# Patient Record
Sex: Male | Born: 1935 | Race: White | Hispanic: No | Smoking: Former smoker
Health system: Southern US, Community
[De-identification: ages and names within clinical notes are randomized; demographics above are authoritative.]

## PROBLEM LIST (undated history)

## (undated) DIAGNOSIS — I4891 Unspecified atrial fibrillation: Secondary | ICD-10-CM

## (undated) DIAGNOSIS — K219 Gastro-esophageal reflux disease without esophagitis: Secondary | ICD-10-CM

## (undated) DIAGNOSIS — E785 Hyperlipidemia, unspecified: Secondary | ICD-10-CM

## (undated) DIAGNOSIS — I2510 Atherosclerotic heart disease of native coronary artery without angina pectoris: Secondary | ICD-10-CM

## (undated) DIAGNOSIS — I519 Heart disease, unspecified: Secondary | ICD-10-CM

## (undated) DIAGNOSIS — I10 Essential (primary) hypertension: Secondary | ICD-10-CM

## (undated) DIAGNOSIS — I471 Supraventricular tachycardia: Secondary | ICD-10-CM

## (undated) HISTORY — DX: Unspecified atrial fibrillation: I48.91

## (undated) HISTORY — DX: Supraventricular tachycardia: I47.1

## (undated) HISTORY — DX: Atherosclerotic heart disease of native coronary artery without angina pectoris: I25.10

## (undated) HISTORY — DX: Gastro-esophageal reflux disease without esophagitis: K21.9

## (undated) HISTORY — DX: Heart disease, unspecified: I51.9

## (undated) HISTORY — DX: Hyperlipidemia, unspecified: E78.5

## (undated) HISTORY — DX: Essential (primary) hypertension: I10

## (undated) HISTORY — PX: INGUINAL HERNIA REPAIR: SUR1180

---

## 1991-07-28 HISTORY — PX: CORONARY ARTERY BYPASS GRAFT: SHX141

## 1999-07-09 ENCOUNTER — Other Ambulatory Visit: Admission: RE | Admit: 1999-07-09 | Discharge: 1999-07-09 | Payer: Self-pay | Admitting: Gastroenterology

## 1999-07-09 ENCOUNTER — Encounter (INDEPENDENT_AMBULATORY_CARE_PROVIDER_SITE_OTHER): Payer: Self-pay | Admitting: Specialist

## 2005-03-31 HISTORY — PX: CARDIOVASCULAR STRESS TEST: SHX262

## 2005-04-10 ENCOUNTER — Inpatient Hospital Stay (HOSPITAL_BASED_OUTPATIENT_CLINIC_OR_DEPARTMENT_OTHER): Admission: RE | Admit: 2005-04-10 | Discharge: 2005-04-10 | Payer: Self-pay | Admitting: Cardiology

## 2005-04-10 HISTORY — PX: CARDIAC CATHETERIZATION: SHX172

## 2006-02-23 ENCOUNTER — Encounter: Admission: RE | Admit: 2006-02-23 | Discharge: 2006-02-23 | Payer: Self-pay | Admitting: Gastroenterology

## 2010-06-05 ENCOUNTER — Ambulatory Visit: Payer: Self-pay | Admitting: Cardiology

## 2010-12-12 NOTE — Cardiovascular Report (Signed)
NAME:  Darryl [name], Darryl [name] NO.:  [id number]   MEDICAL RECORD NO.:  [id number]          PATIENT TYPE:  OIB   LOCATION:  1965                         FACILITY:  [facility]   PHYSICIAN:  Peter M. Swaziland, M.D.  DATE OF BIRTH:  10-05-1927   DATE OF PROCEDURE:  04/10/2005  DATE OF DISCHARGE:                              CARDIAC CATHETERIZATION   HISTORY OF PRESENT ILLNESS:  Mr. Mcmann is a 77 year old white male who is  status post coronary artery bypass surgery in 1993. Cardiac catheterization  in 1997 showed a new occlusion of the right coronary which was not grafted.  He has been treated medically since that time. He presents recently with  symptoms of increased chest pain and nitroglycerin use. Cardiolite study  demonstrates new anterior apical ischemia.   PROCEDURES:  Left heart catheterization, coronary and left ventricular  angiography, LIMA graft angiography, left subclavian artery angiography,  saphenous vein graft angiography x3. Access is via right femoral artery  using standard Seldinger technique.   EQUIPMENT:  4-French, 4 cm right and left Judkins catheter, 4-French pigtail  catheter, 4-French IMA catheter.   CONTRAST:  120 cc of Omnipaque.   MEDICATIONS:  Local anesthesia with 1% Xylocaine.   HEMODYNAMIC DATA:  Aortic pressure was 150/75 with a mean of 104.  Left  ventricle pressure is 156 with EDP of 31 mmHg.   ANGIOGRAPHIC DATA:  Left coronary arises and distributes normally. The left  main coronary is diffusely diseased in the mid to distal vessel up to 30-  40%.   The left anterior descending artery gives rise to two diagonal branches that  arise high in the vessel. The LAD is occluded after the second diagonal  branch. The first diagonal branch is diffusely diseased proximally up to 80-  90%. The second diagonal branch also has significant proximal disease up to  90%.   The left circumflex coronary artery is occluded proximally. The distal left  circumflex in the AV groove fills by left to left collaterals.   The right coronary artery is occluded proximally after the conus branch. The  distal right coronary fills both by right to right and left to right  collaterals.   The saphenous vein graft to the first obtuse marginal vessel is widely  patent, but has diffuse irregularities. It fills a very large obtuse  marginal vessel which has no significant disease and excellent runoff.   Saphenous vein graft to the second diagonal is occluded.   The saphenous vein graft to the first diagonal is patent. There is a 90%  stenosis in the native vessel following the graft insertion. This graft  retrograde fills the septal perforator branch which also offers good  collaterals to the distal right coronary artery.   The left subclavian artery is without significant stenosis.   The LIMA graft to the LAD is widely patent. The far distal LAD at the apex  has 80% narrowing.   Left ventricular angiography was performed in the RAO view. This  demonstrates normal left ventricular size with severe inferobasal  hypokinesia and distal anterior wall hypokinesia.  Overall, left ventricular  systolic function is mild to moderately reduced with ejection fraction  estimated at 45%. There is mild mitral regurgitation.   FINAL INTERPRETATION:  1.  Severe three-vessel obstructive coronary artery disease.  2.  Patent LIMA graft to LAD.  3.  Patent saphenous vein graft to the first obtuse marginal vessel.  4.  Patent saphenous vein graft to the first diagonal with high-grade      stenosis in the native vessel following the vein graft insertion.  5.  Occluded saphenous vein graft to second diagonal.  6.  Mild to moderate left ventricular dysfunction.   PLAN:  It would appear that the areas of ischemia involve the distribution  of the first and second diagonal branch. Both of these vessels are small in  caliber. The large LAD and marginal vessels are well  supplied by grafts and  the right coronary distribution was supplied by collaterals. At this point I  would recommend aggressive medical therapy and risk factor modification.           ______________________________  Peter M. Swaziland, M.D.     PMJ/MEDQ  D:  04/10/2005  T:  04/10/2005  Job:  [id number]   cc:   Georgann Housekeeper, MD  [address]  [place]  [place] [address]  Fax: [phone]

## 2010-12-12 NOTE — H&P (Signed)
NAME:  Darryl [name], Darryl [name] NO.:  [id number]   MEDICAL RECORD NO.:  [id number]          PATIENT TYPE:  AMB   LOCATION:                               FACILITY:  [facility]   PHYSICIAN:  Peter M. Swaziland, M.D.  DATE OF BIRTH:  14-Dec-1927   DATE OF ADMISSION:  04/10/2005  DATE OF DISCHARGE:                                HISTORY & PHYSICAL   HISTORY OF PRESENT ILLNESS:  Darryl [name] is a 77 year old white male with  known history of coronary artery disease.  He is status post coronary artery  bypass surgery in 1993.  This included an LIMA graft to the LAD, a saphenous  vein graft to the first diagonal, a saphenous vein graft to the second  diagonal, and a saphenous vein graft to the obtuse marginal branch.  He  subsequently had cardiac catheterization in 1997.  This demonstrated that  all his native vessels were occluded proximally.  The LIMA graft to the LAD  was patent.  The LIMA graft to the first diagonal was patent and the  saphenous vein graft to the marginal vessel was patent.  However, the  saphenous vein graft to the second diagonal which was a very small branch  was occluded.  The native right coronary artery was also occluded with a  long segment of disease, but did have left-to-right collaterals.  He has  been managed medically since that time.  Recently, he presents with symptoms  of substernal chest tightness.  Because of these symptoms he underwent a  follow-up stress Cardiolite study.  Patient had evidence of fixed inferior  basal defect as well as a predominantly fixed partially reversible anterior  apical defect which was new compared to 2002.  His ejection fraction had  declined from 68% to 43%.  Because of these changes it was recommended he  undergo cardiac catheterization at this time.   PAST MEDICAL HISTORY:  1.  Significant for history of paroxysmal supraventricular tachycardia.  2.  He has a history of hypertension.  3.  History of hyperlipidemia.  4.   He has a history of gastroesophageal reflux disease.   He has no known allergies.   CURRENT MEDICATIONS:  1.  Lotensin 20 mg b.i.d.  2.  Atenolol 75 mg per day.  3.  Aspirin 81 mg per day.  4.  Imdur 60 mg per day.  5.  Lorazepam 1 mg q.8h.  6.  HCTZ 12.5 mg per day.  7.  Protonix 40 mg per day.  8.  Crestor 10 mg per day.   SOCIAL HISTORY:  Patient is married.  He denies tobacco or alcohol use at  this time.  He has no children.   FAMILY HISTORY:  Positive with father having history of coronary disease.   REVIEW OF SYSTEMS:  As noted in HPI.  He does note repetitive symptoms of  tachy palpitations, particularly if he eats a heavy meal or has esophageal  reflux symptoms.  He has had no dizziness or syncope.  He had previously  been intolerant of Lipitor and Pravachol due to  myalgias.   PHYSICAL EXAMINATION:  GENERAL:  Patient is a thin, elderly white male in no  distress.  VITAL SIGNS:  Weight 152, blood pressure 142/90, pulse 60 and irregular.  HEENT:  Normocephalic, atraumatic.  Pupils are equal, round, and reactive to  light and accommodation.  Extraocular movements are full.  Sclerae clear.  Oropharynx is clear.  NECK:  Without JVD, adenopathy, thyromegaly, or bruits.  LUNGS:  Clear.  CARDIAC:  Without gallop, murmur, rub, or click.  ABDOMEN:  Soft and nontender.  He has no hepatosplenomegaly, masses, or  bruits.  EXTREMITIES:  Without edema.  Pulses are 2+ and symmetric throughout.  He  has no cyanosis.  NEUROLOGIC:  Intact.   LABORATORY DATA:  Resting ECG shows normal sinus rhythm with nonspecific ST  abnormality.  He has incomplete right bundle branch block.  Chest x-ray  shows no active disease.  Coags are normal.  CBC is normal.  Glucose is 145,  BUN 18, creatinine 1.1, sodium 143, potassium 3.6, chloride 102, CO2 31.   IMPRESSION:  1.  Recurrent angina pectoris.  The patient has an abnormal Cardiolite study      showing a new anterior apical defect.  2.   Status post CABG in 1993.  3.  Hypertension.  4.  Hyperlipidemia.  5.  History of paroxysmal supraventricular tachycardia.  6.  Gastroesophageal reflux disease.   PLAN:  Will proceed with diagnostic cardiac catheterization with further  therapy pending these results.           ______________________________  Peter M. [place]azil[place], M.D.     PMJ/MEDQ  D:  04/06/2005  T:  04/06/2005  Job:  [id number]   cc:   Georgann Housekeeper, MD  [address]  [place]  [place] [address]  Fax: [phone]

## 2011-01-21 ENCOUNTER — Other Ambulatory Visit: Payer: Self-pay | Admitting: *Deleted

## 2011-01-21 MED ORDER — BENAZEPRIL HCL 20 MG PO TABS: 20.0000 mg | ORAL_TABLET | Freq: Two times a day (BID) | ORAL | Status: DC

## 2011-01-21 MED ORDER — HYDROCHLOROTHIAZIDE 12.5 MG PO CAPS: 12.5000 mg | ORAL_CAPSULE | Freq: Every day | ORAL | Status: DC

## 2011-01-21 NOTE — Telephone Encounter (Signed)
escribe medication per fax request  

## 2011-04-06 ENCOUNTER — Other Ambulatory Visit: Payer: Self-pay | Admitting: Cardiology

## 2011-04-06 MED ORDER — LORAZEPAM 1 MG PO TABS: 1.0000 mg | ORAL_TABLET | Freq: Three times a day (TID) | ORAL | Status: DC

## 2011-04-06 NOTE — Telephone Encounter (Signed)
Pt wants refill lorazapam please call

## 2011-04-14 ENCOUNTER — Other Ambulatory Visit: Payer: Self-pay | Admitting: *Deleted

## 2011-06-05 ENCOUNTER — Encounter: Payer: Self-pay | Admitting: Cardiology

## 2011-06-08 ENCOUNTER — Ambulatory Visit (INDEPENDENT_AMBULATORY_CARE_PROVIDER_SITE_OTHER): Payer: Medicare Other | Admitting: Cardiology

## 2011-06-08 ENCOUNTER — Encounter: Payer: Self-pay | Admitting: Cardiology

## 2011-06-08 VITALS — BP 144/88 | HR 77 | Ht 66.0 in | Wt 143.8 lb

## 2011-06-08 DIAGNOSIS — I4891 Unspecified atrial fibrillation: Secondary | ICD-10-CM

## 2011-06-08 DIAGNOSIS — I2510 Atherosclerotic heart disease of native coronary artery without angina pectoris: Secondary | ICD-10-CM

## 2011-06-08 DIAGNOSIS — R5381 Other malaise: Secondary | ICD-10-CM

## 2011-06-08 DIAGNOSIS — E785 Hyperlipidemia, unspecified: Secondary | ICD-10-CM

## 2011-06-08 DIAGNOSIS — R5383 Other fatigue: Secondary | ICD-10-CM

## 2011-06-08 DIAGNOSIS — I10 Essential (primary) hypertension: Secondary | ICD-10-CM

## 2011-06-08 DIAGNOSIS — I519 Heart disease, unspecified: Secondary | ICD-10-CM

## 2011-06-08 MED ORDER — RIVAROXABAN 20 MG PO TABS: 20.0000 mg | ORAL_TABLET | Freq: Every day | ORAL | Status: DC

## 2011-06-08 NOTE — Patient Instructions (Signed)
We will schedule you for fasting lab work and an echocardiogram.  I recommend Xarelto 20 mg daily to reduce your risk of stroke. Stop taking ASA.  Continue your other medication.  I will see you again in one month.

## 2011-06-13 DIAGNOSIS — E785 Hyperlipidemia, unspecified: Secondary | ICD-10-CM | POA: Insufficient documentation

## 2011-06-13 DIAGNOSIS — I10 Essential (primary) hypertension: Secondary | ICD-10-CM | POA: Insufficient documentation

## 2011-06-13 DIAGNOSIS — I2510 Atherosclerotic heart disease of native coronary artery without angina pectoris: Secondary | ICD-10-CM | POA: Insufficient documentation

## 2011-06-13 DIAGNOSIS — I4891 Unspecified atrial fibrillation: Secondary | ICD-10-CM | POA: Insufficient documentation

## 2011-06-13 DIAGNOSIS — I519 Heart disease, unspecified: Secondary | ICD-10-CM | POA: Insufficient documentation

## 2011-06-13 NOTE — Assessment & Plan Note (Addendum)
We'll followup fasting chemistries and lipid panel today. I would recommend statin therapy but the patient has refused in the past.

## 2011-06-13 NOTE — Progress Notes (Signed)
Leverne Humbles Date of Birth: 1927/10/03 Medical Record #[id number]  History of Present Illness: Darryl [name] is seen for yearly followup today. In general he feels well. He is still depressed with the loss of his wife last year. He denies any significant cardiac symptoms. Occasionally he feels a little bit lightheaded. He's had no syncope. He denies any chest pain or dyspnea. He has had no increase in edema. He has no prior history of CVA or TIA. He's had no bleeding problems.  Current Outpatient Prescriptions on File Prior to Visit  Medication Sig Dispense Refill  . aspirin 81 MG tablet Take 81 mg by mouth daily.        . ATENOLOL PO Take 75 mg by mouth daily.        . benazepril (LOTENSIN) 20 MG tablet Take 20 mg by mouth 2 (two) times daily.        . hydrochlorothiazide (,MICROZIDE/HYDRODIURIL,) 12.5 MG capsule Take 1 capsule (12.5 mg total) by mouth daily.  90 capsule  3  . isosorbide mononitrate (IMDUR) 60 MG 24 hr tablet Take 60 mg by mouth 2 (two) times daily.        . pantoprazole (PROTONIX) 40 MG tablet Take 40 mg by mouth as needed.       Marland Kitchen LORazepam (ATIVAN) 1 MG tablet Take 1 tablet (1 mg total) by mouth every 8 (eight) hours.  100 tablet  3    Allergies  Allergen Reactions  . Calan Sr     Past Medical History  Diagnosis Date  . Coronary artery disease   . LV dysfunction   . Hyperlipidemia   . Hypertension   . SVT (supraventricular tachycardia)   . GERD (gastroesophageal reflux disease)     Past Surgical History  Procedure Date  . Coronary artery bypass graft 1993    SAPHENOUS VEIN GRAFT TO THE FIRST OBTUSE MARGINAL VESSEL, SAPHENOUS VEIN GRAFT TO THE SECOND  DIAGONAL, SAPHENOUS VEIN GRAFT TO THE FIRST DIAGONAL , AND LIMA GRAFT TO THE LAD  . Inguinal hernia repair   . Cardiac catheterization 04/10/2005    EF 45%. SEVERE 3 VESSEL OBSTRUCTIVE  CAD. PATENT LIMA GRAFT TO LAD. MILD TO MODERATE LV DYSFUNCTION. OCCLUSION OF THE VEIN GRAFT TO THE DIAGONIAL  . Cardiovascular  stress test 03/31/2005    EF 43%. ISCHEMIA AND OR CAR IN THE ANTERIOAPICAL DISTRIBUTION. THERE IS A FIXED DEFECT IN THE INFEROBASAL REGION.    History  Smoking status  . Former Smoker  . Quit date: 06/05/1991  Smokeless tobacco  . Not on file    History  Alcohol Use No    Family History  Problem Relation Age of Onset  . Coronary artery disease Father     Review of Systems: As noted in history of present illness.  All other systems were reviewed and are negative.  Physical Exam: BP 144/88  Pulse 77  Ht 5\' 6"  (1.676 m)  Wt 143 lb 12.8 oz (65.227 kg)  BMI 23.21 kg/m2 He is an elderly white male in no acute distress.The patient is alert and oriented x 3.  The mood and affect are normal.  The skin is warm and dry.  Color is normal.  The HEENT exam reveals that the sclera are nonicteric.  The mucous membranes are moist.  The carotids are 2+ without bruits.  There is no thyromegaly.  There is no JVD.  The lungs are clear.  The chest wall is non tender.  The heart exam reveals an  irregular rate with a normal S1 and S2.  There are no murmurs, gallops, or rubs.  The PMI is not displaced.   Abdominal exam reveals good bowel sounds.  There is no guarding or rebound.  There is no hepatosplenomegaly or tenderness.  There are no masses.  Exam of the legs reveal no clubbing, cyanosis, or edema.  The legs are without rashes.  The distal pulses are intact.  Cranial nerves II - XII are intact.  Motor and sensory functions are intact.  The gait is normal.  LABORATORY DATA: ECG demonstrates atrial fibrillation with controlled ventricular response of 80 beats per minute. There is left axis deviation and nonspecific ST-T wave abnormality. QT is borderline prolonged at 475 ms.  Assessment / Plan:

## 2011-06-13 NOTE — Assessment & Plan Note (Signed)
Ejection fraction in the past was 45%. We will update his echocardiogram. He is on beta blocker and ACE inhibitor therapy. He has no signs or symptoms of congestive heart failure.

## 2011-06-13 NOTE — Assessment & Plan Note (Signed)
Patient has new onset of atrial fibrillation. The duration of this is unknown. He is asymptomatic. His rate control is satisfactory. I recommended anticoagulation to reduce his risk of stroke. We will start him on Xarelto 20 mg daily. I recommended he stop taking aspirin. We will obtain fasting lab work today including chemistries, CBC, TSH, and lipids. We'll schedule him for an echocardiogram. I'll followup again in one month. Unless he develops significant symptoms I would recommend long-term treatment with rate control and anticoagulation.

## 2011-06-13 NOTE — Assessment & Plan Note (Signed)
Patient is asymptomatic. We will continue medical therapy with beta blocker, ACE inhibitor, and nitrates.

## 2011-06-16 ENCOUNTER — Other Ambulatory Visit (INDEPENDENT_AMBULATORY_CARE_PROVIDER_SITE_OTHER): Payer: Medicare Other | Admitting: *Deleted

## 2011-06-16 ENCOUNTER — Ambulatory Visit (HOSPITAL_COMMUNITY): Payer: Medicare Other | Attending: Cardiology | Admitting: Radiology

## 2011-06-16 DIAGNOSIS — I4891 Unspecified atrial fibrillation: Secondary | ICD-10-CM

## 2011-06-16 DIAGNOSIS — I10 Essential (primary) hypertension: Secondary | ICD-10-CM

## 2011-06-16 DIAGNOSIS — I059 Rheumatic mitral valve disease, unspecified: Secondary | ICD-10-CM | POA: Insufficient documentation

## 2011-06-16 DIAGNOSIS — I379 Nonrheumatic pulmonary valve disorder, unspecified: Secondary | ICD-10-CM | POA: Insufficient documentation

## 2011-06-16 DIAGNOSIS — R5383 Other fatigue: Secondary | ICD-10-CM

## 2011-06-16 DIAGNOSIS — I2510 Atherosclerotic heart disease of native coronary artery without angina pectoris: Secondary | ICD-10-CM

## 2011-06-16 DIAGNOSIS — I079 Rheumatic tricuspid valve disease, unspecified: Secondary | ICD-10-CM | POA: Insufficient documentation

## 2011-06-16 DIAGNOSIS — E785 Hyperlipidemia, unspecified: Secondary | ICD-10-CM | POA: Insufficient documentation

## 2011-06-16 LAB — HEPATIC FUNCTION PANEL
ALT: 15 U/L (ref 0–53)
Bilirubin, Direct: 0 mg/dL (ref 0.0–0.3)
Total Bilirubin: 0.9 mg/dL (ref 0.3–1.2)

## 2011-06-16 LAB — CBC WITH DIFFERENTIAL/PLATELET
Basophils Relative: 0.4 % (ref 0.0–3.0)
HCT: 43 % (ref 39.0–52.0)
Hemoglobin: 14.3 g/dL (ref 13.0–17.0)
Lymphocytes Relative: 38.9 % (ref 12.0–46.0)
Lymphs Abs: 2.8 10*3/uL (ref 0.7–4.0)
MCHC: 33.2 g/dL (ref 30.0–36.0)
MCV: 83.8 fl (ref 78.0–100.0)
Monocytes Relative: 7.8 % (ref 3.0–12.0)
Neutro Abs: 3.7 10*3/uL (ref 1.4–7.7)
Neutrophils Relative %: 51.6 % (ref 43.0–77.0)
Platelets: 211 10*3/uL (ref 150.0–400.0)
RDW: 15.1 % — ABNORMAL HIGH (ref 11.5–14.6)
WBC: 7.2 10*3/uL (ref 4.5–10.5)

## 2011-06-16 LAB — BASIC METABOLIC PANEL
BUN: 18 mg/dL (ref 6–23)
Calcium: 9.2 mg/dL (ref 8.4–10.5)
Creatinine, Ser: 1.2 mg/dL (ref 0.4–1.5)
Potassium: 3.7 mEq/L (ref 3.5–5.1)

## 2011-06-16 LAB — LIPID PANEL
Cholesterol: 198 mg/dL (ref 0–200)
VLDL: 29.4 mg/dL (ref 0.0–40.0)

## 2011-06-16 LAB — TSH: TSH: 2.25 u[IU]/mL (ref 0.35–5.50)

## 2011-07-09 ENCOUNTER — Encounter: Payer: Self-pay | Admitting: Cardiology

## 2011-07-09 ENCOUNTER — Ambulatory Visit (INDEPENDENT_AMBULATORY_CARE_PROVIDER_SITE_OTHER): Payer: Medicare Other | Admitting: Cardiology

## 2011-07-09 VITALS — BP 138/82 | HR 95 | Ht 65.5 in | Wt 143.0 lb

## 2011-07-09 DIAGNOSIS — I10 Essential (primary) hypertension: Secondary | ICD-10-CM

## 2011-07-09 DIAGNOSIS — I519 Heart disease, unspecified: Secondary | ICD-10-CM

## 2011-07-09 DIAGNOSIS — E785 Hyperlipidemia, unspecified: Secondary | ICD-10-CM

## 2011-07-09 DIAGNOSIS — I4891 Unspecified atrial fibrillation: Secondary | ICD-10-CM

## 2011-07-09 DIAGNOSIS — I2510 Atherosclerotic heart disease of native coronary artery without angina pectoris: Secondary | ICD-10-CM

## 2011-07-09 NOTE — Progress Notes (Signed)
Darryl [name] Date of Birth: 25-Dec-1927 Medical Record #[id number]  History of Present Illness: Darryl [name] is seen for followup of his atrial fibrillation. This is newly diagnosed in November. We recommended anticoagulation but he reports now that he does not want to go on anticoagulation. He is on aspirin. He fully understands that he is at increased risk of stroke. He understands this and states that he is ready to die. He has no symptoms related to the atrial fibrillation. He denies any chest pain, shortness of breath, or palpitations. He has no edema.  Current Outpatient Prescriptions on File Prior to Visit  Medication Sig Dispense Refill  . aspirin 81 MG tablet Take 81 mg by mouth daily.        . ATENOLOL PO Take 75 mg by mouth daily.        . benazepril (LOTENSIN) 20 MG tablet Take 20 mg by mouth 2 (two) times daily.        . hydrochlorothiazide (,MICROZIDE/HYDRODIURIL,) 12.5 MG capsule Take 1 capsule (12.5 mg total) by mouth daily.  90 capsule  3  . isosorbide mononitrate (IMDUR) 60 MG 24 hr tablet Take 60 mg by mouth 2 (two) times daily.        . pantoprazole (PROTONIX) 40 MG tablet Take 40 mg by mouth as needed.       Marland Kitchen DISCONTD: LORazepam (ATIVAN) 1 MG tablet Take 1 tablet (1 mg total) by mouth every 8 (eight) hours.  100 tablet  3  . DISCONTD: LORazepam (ATIVAN) 1 MG tablet Take 1 tablet by mouth Twice daily.        Allergies  Allergen Reactions  . Calan Sr     Past Medical History  Diagnosis Date  . Coronary artery disease   . LV dysfunction   . Hyperlipidemia   . Hypertension   . SVT (supraventricular tachycardia)   . GERD (gastroesophageal reflux disease)   . Atrial fibrillation     Past Surgical History  Procedure Date  . Coronary artery bypass graft 1993    SAPHENOUS VEIN GRAFT TO THE FIRST OBTUSE MARGINAL VESSEL, SAPHENOUS VEIN GRAFT TO THE SECOND  DIAGONAL, SAPHENOUS VEIN GRAFT TO THE FIRST DIAGONAL , AND LIMA GRAFT TO THE LAD  . Inguinal hernia repair   .  Cardiac catheterization 04/10/2005    EF 45%. SEVERE 3 VESSEL OBSTRUCTIVE  CAD. PATENT LIMA GRAFT TO LAD. MILD TO MODERATE LV DYSFUNCTION. OCCLUSION OF THE VEIN GRAFT TO THE DIAGONIAL  . Cardiovascular stress test 03/31/2005    EF 43%. ISCHEMIA AND OR CAR IN THE ANTERIOAPICAL DISTRIBUTION. THERE IS A FIXED DEFECT IN THE INFEROBASAL REGION.    History  Smoking status  . Former Smoker  . Quit date: 06/05/1991  Smokeless tobacco  . Not on file    History  Alcohol Use No    Family History  Problem Relation Age of Onset  . Coronary artery disease Father   . Heart disease Father   . Heart attack Father   . Heart disease Mother   . Heart attack Brother     Review of Systems: As noted in history of present illness.  All other systems were reviewed and are negative.  Physical Exam: BP 138/82  Pulse 95  Ht 5' 5.5" (1.664 m)  Wt 143 lb (64.864 kg)  BMI 23.43 kg/m2  SpO2 98% He is an elderly white male in no acute distress.The patient is alert and oriented x 3.  The mood and affect are normal.  The skin is warm and dry.  Color is normal.  The HEENT exam reveals that the sclera are nonicteric.  The mucous membranes are moist.  The carotids are 2+ without bruits.  There is no thyromegaly.  There is no JVD.  The lungs are clear.  The chest wall is non tender.  The heart exam reveals an irregular rate with a normal S1 and S2.  There are no murmurs, gallops, or rubs.  The PMI is not displaced.   Abdominal exam reveals good bowel sounds.  There is no guarding or rebound.  There is no hepatosplenomegaly or tenderness.  There are no masses.  Exam of the legs reveal no clubbing, cyanosis, or edema.  The legs are without rashes.  The distal pulses are intact.  Cranial nerves II - XII are intact.  Motor and sensory functions are intact.  The gait is normal.  LABORATORY DATA: Laboratory data was reviewed from last month. CBC was normal, chemistries were normal, TSH was normal. Lipid showed mild  elevation of his LDL to 125. Echocardiogram demonstrated inferior basal akinesis with overall ejection fraction of 45%. There was biatrial enlargement.  Assessment / Plan:

## 2011-07-09 NOTE — Assessment & Plan Note (Signed)
No clinical symptoms of angina. We will continue with beta blocker and nitrate therapy.

## 2011-07-09 NOTE — Assessment & Plan Note (Signed)
Blood pressure control is satisfactory. 

## 2011-07-09 NOTE — Assessment & Plan Note (Signed)
His rate is well controlled and he is asymptomatic. He does not want to take anticoagulation therapy and fully understands the risk of potential stroke. We'll continue with aspirin therapy.

## 2011-07-09 NOTE — Patient Instructions (Addendum)
Continue your current medication.  I will see you again in 6

## 2011-07-09 NOTE — Assessment & Plan Note (Signed)
LDL is not at goal of 70. Patient refuses to take lipid-lowering therapy.

## 2011-07-30 ENCOUNTER — Ambulatory Visit: Payer: Medicare Other | Admitting: Family Medicine

## 2011-12-09 ENCOUNTER — Other Ambulatory Visit: Payer: Self-pay | Admitting: Cardiology

## 2012-05-03 ENCOUNTER — Telehealth: Payer: Self-pay

## 2012-05-03 ENCOUNTER — Other Ambulatory Visit: Payer: Self-pay | Admitting: Cardiology

## 2012-05-03 MED ORDER — ISOSORBIDE MONONITRATE ER 60 MG PO TB24: 60.0000 mg | ORAL_TABLET | Freq: Two times a day (BID) | ORAL | Status: DC

## 2012-05-03 MED ORDER — LORAZEPAM 1 MG PO TABS: 1.0000 mg | ORAL_TABLET | Freq: Three times a day (TID) | ORAL | Status: DC

## 2012-05-03 NOTE — Telephone Encounter (Signed)
Spoke to patient was told he needs appointment with Dr.Jordan.Appointment scheduled 06/15/12.Refill for Ativan sent to [facility].Advised needs a PCP.

## 2012-05-03 NOTE — Telephone Encounter (Signed)
Patient called requesting refill on isosorbide sent to [facility] in [place].Refill sent to [facility] [address] [place].

## 2012-05-03 NOTE — Telephone Encounter (Signed)
[facility] - [phone]

## 2012-05-30 ENCOUNTER — Other Ambulatory Visit: Payer: Self-pay

## 2012-05-30 MED ORDER — ATENOLOL 50 MG PO TABS: 75.0000 mg | ORAL_TABLET | Freq: Every day | ORAL | Status: DC

## 2012-06-15 ENCOUNTER — Ambulatory Visit (INDEPENDENT_AMBULATORY_CARE_PROVIDER_SITE_OTHER): Payer: Medicare Other | Admitting: Cardiology

## 2012-06-15 ENCOUNTER — Encounter: Payer: Self-pay | Admitting: Cardiology

## 2012-06-15 VITALS — BP 108/64 | HR 82 | Ht 65.0 in | Wt 140.0 lb

## 2012-06-15 DIAGNOSIS — I519 Heart disease, unspecified: Secondary | ICD-10-CM

## 2012-06-15 DIAGNOSIS — E785 Hyperlipidemia, unspecified: Secondary | ICD-10-CM

## 2012-06-15 DIAGNOSIS — I4891 Unspecified atrial fibrillation: Secondary | ICD-10-CM

## 2012-06-15 DIAGNOSIS — I2510 Atherosclerotic heart disease of native coronary artery without angina pectoris: Secondary | ICD-10-CM

## 2012-06-15 DIAGNOSIS — I10 Essential (primary) hypertension: Secondary | ICD-10-CM

## 2012-06-15 MED ORDER — LORAZEPAM 1 MG PO TABS: 1.0000 mg | ORAL_TABLET | Freq: Three times a day (TID) | ORAL | Status: DC

## 2012-06-15 NOTE — Progress Notes (Signed)
Darryl [name] Date of Birth: 1927/07/29 Medical Record #[id number]  History of Present Illness: Darryl [name] is seen for followup of his atrial fibrillation. This was diagnosed in November 2012. He has refused anticoagulation. He remains on aspirin daily. He denies any chest pain, shortness of breath, or TIA symptoms. He is really not interested in any preventative measures. He states he eats 1 meal a day at [employer]. He refuses to take statin therapy. Overall he feels he is doing pretty well. He spends most of his day on his computer.  Current Outpatient Prescriptions on File Prior to Visit  Medication Sig Dispense Refill  . aspirin 81 MG tablet Take 81 mg by mouth daily.        Marland Kitchen atenolol (TENORMIN) 50 MG tablet Take 1.5 tablets (75 mg total) by mouth daily.  60 tablet  5  . ATENOLOL PO Take 75 mg by mouth daily.        . benazepril (LOTENSIN) 20 MG tablet TAKE 1 TABLET TWICE A DAY  180 tablet  3  . hydrochlorothiazide (MICROZIDE) 12.5 MG capsule TAKE 1 CAPSULE DAILY  90 capsule  3  . isosorbide mononitrate (IMDUR) 60 MG 24 hr tablet Take 1 tablet (60 mg total) by mouth 2 (two) times daily.  60 tablet  3  . pantoprazole (PROTONIX) 40 MG tablet Take 40 mg by mouth as needed.       . [DISCONTINUED] LORazepam (ATIVAN) 1 MG tablet Take 1 tablet (1 mg total) by mouth every 8 (eight) hours.  90 tablet  0  . [DISCONTINUED] LORazepam (ATIVAN) 1 MG tablet Take 1 mg by mouth 3 (three) times daily.          Allergies  Allergen Reactions  . Verapamil Hcl Er     Past Medical History  Diagnosis Date  . Coronary artery disease   . LV dysfunction   . Hyperlipidemia   . Hypertension   . SVT (supraventricular tachycardia)   . GERD (gastroesophageal reflux disease)   . Atrial fibrillation     Past Surgical History  Procedure Date  . Coronary artery bypass graft 1993    SAPHENOUS VEIN GRAFT TO THE FIRST OBTUSE MARGINAL VESSEL, SAPHENOUS VEIN GRAFT TO THE SECOND  DIAGONAL, SAPHENOUS VEIN GRAFT TO  THE FIRST DIAGONAL , AND LIMA GRAFT TO THE LAD  . Inguinal hernia repair   . Cardiac catheterization 04/10/2005    EF 45%. SEVERE 3 VESSEL OBSTRUCTIVE  CAD. PATENT LIMA GRAFT TO LAD. MILD TO MODERATE LV DYSFUNCTION. OCCLUSION OF THE VEIN GRAFT TO THE DIAGONIAL  . Cardiovascular stress test 03/31/2005    EF 43%. ISCHEMIA AND OR CAR IN THE ANTERIOAPICAL DISTRIBUTION. THERE IS A FIXED DEFECT IN THE INFEROBASAL REGION.    History  Smoking status  . Former Smoker  . Quit date: 06/05/1991  Smokeless tobacco  . Not on file    History  Alcohol Use No    Family History  Problem Relation Age of Onset  . Coronary artery disease Father   . Heart disease Father   . Heart attack Father   . Heart disease Mother   . Heart attack Brother     Review of Systems: As noted in history of present illness.  All other systems were reviewed and are negative.  Physical Exam: BP 108/64  Pulse 82  Ht 5\' 5"  (1.651 m)  Wt 140 lb (63.504 kg)  BMI 23.30 kg/m2 He is an elderly white male in no acute distress.The patient  is alert and oriented x 3.  The mood and affect are normal.  The skin is warm and dry.  Color is normal.  The HEENT exam reveals that the sclera are nonicteric.  The mucous membranes are moist.  The carotids are 2+ without bruits.  There is no thyromegaly.  There is no JVD.  The lungs are clear.  The chest wall is non tender.  The heart exam reveals an irregular rate with a normal S1 and S2.  There are no murmurs, gallops, or rubs.  The PMI is not displaced.   Abdominal exam reveals good bowel sounds.  There is no guarding or rebound.  There is no hepatosplenomegaly or tenderness.  There are no masses.  Exam of the legs reveal no clubbing, cyanosis, or edema.  The legs are without rashes.  The distal pulses are intact.  Cranial nerves II - XII are intact.  Motor and sensory functions are intact.  The gait is normal.  LABORATORY DATA: ECG today demonstrates atrial fibrillation with a rate of 78  beats per minute. Occasional PVCs. He has diffuse nonspecific ST and T wave changes with left axis deviation.  Assessment / Plan: 1. Atrial fibrillation. Her rate is controlled on the atenolol. He has refused anticoagulant therapy. Continue aspirin and followup again in one year. 2. Coronary disease. Patient is asymptomatic on atenolol and isosorbide. 3. Hypertension, controlled. 4. Hyperlipidemia. Patient refuses lipid-lowering therapy. Counseled him on appropriate diet but he is not willing to change.

## 2012-06-15 NOTE — Patient Instructions (Signed)
Continue your current therapy  I will see you again in one year.   

## 2012-12-01 ENCOUNTER — Other Ambulatory Visit: Payer: Self-pay

## 2012-12-01 MED ORDER — HYDROCHLOROTHIAZIDE 12.5 MG PO CAPS: 12.5000 mg | ORAL_CAPSULE | Freq: Every day | ORAL | Status: DC

## 2012-12-01 MED ORDER — ATENOLOL 50 MG PO TABS: 75.0000 mg | ORAL_TABLET | Freq: Every day | ORAL | Status: DC

## 2012-12-01 MED ORDER — BENAZEPRIL HCL 20 MG PO TABS: 20.0000 mg | ORAL_TABLET | Freq: Every day | ORAL | Status: DC

## 2012-12-02 ENCOUNTER — Telehealth: Payer: Self-pay

## 2012-12-02 NOTE — Telephone Encounter (Signed)
pharmacy called to verify dosage of pt benazapril.per Dr.Jordan note 05/2012 pt should be taken 20mg  BID confirmed with pharmacist [facility].Verified refill amount 180 r-0

## 2013-04-11 ENCOUNTER — Other Ambulatory Visit: Payer: Self-pay

## 2013-04-11 MED ORDER — BENAZEPRIL HCL 20 MG PO TABS: 20.0000 mg | ORAL_TABLET | Freq: Every day | ORAL | Status: DC

## 2013-04-11 MED ORDER — HYDROCHLOROTHIAZIDE 12.5 MG PO CAPS: 12.5000 mg | ORAL_CAPSULE | Freq: Every day | ORAL | Status: DC

## 2013-04-12 ENCOUNTER — Other Ambulatory Visit: Payer: Self-pay | Admitting: Cardiology

## 2013-04-13 ENCOUNTER — Telehealth: Payer: Self-pay | Admitting: *Deleted

## 2013-04-13 NOTE — Telephone Encounter (Signed)
Pharmacy called to verify dosage of pt benazapril.per Dr.Jordan note 05/2012 pt should be taken 20mg  BID. Confirmed with pharmacist

## 2013-06-08 ENCOUNTER — Other Ambulatory Visit: Payer: Self-pay

## 2013-06-08 MED ORDER — ISOSORBIDE MONONITRATE ER 60 MG PO TB24: 60.0000 mg | ORAL_TABLET | Freq: Two times a day (BID) | ORAL | Status: DC

## 2013-06-15 ENCOUNTER — Other Ambulatory Visit: Payer: Self-pay

## 2013-06-15 ENCOUNTER — Telehealth: Payer: Self-pay | Admitting: Cardiology

## 2013-06-15 ENCOUNTER — Encounter: Payer: Self-pay | Admitting: Cardiology

## 2013-06-15 ENCOUNTER — Ambulatory Visit (INDEPENDENT_AMBULATORY_CARE_PROVIDER_SITE_OTHER): Payer: Medicare Other | Admitting: Cardiology

## 2013-06-15 VITALS — BP 119/77 | HR 85 | Ht 65.0 in | Wt 135.0 lb

## 2013-06-15 DIAGNOSIS — I2510 Atherosclerotic heart disease of native coronary artery without angina pectoris: Secondary | ICD-10-CM

## 2013-06-15 DIAGNOSIS — I4891 Unspecified atrial fibrillation: Secondary | ICD-10-CM

## 2013-06-15 DIAGNOSIS — I10 Essential (primary) hypertension: Secondary | ICD-10-CM

## 2013-06-15 MED ORDER — ATENOLOL 50 MG PO TABS: 75.0000 mg | ORAL_TABLET | Freq: Every day | ORAL | Status: DC

## 2013-06-15 MED ORDER — LORAZEPAM 1 MG PO TABS: 1.0000 mg | ORAL_TABLET | Freq: Three times a day (TID) | ORAL | Status: DC

## 2013-06-15 MED ORDER — HYDROCHLOROTHIAZIDE 12.5 MG PO CAPS: 12.5000 mg | ORAL_CAPSULE | Freq: Every day | ORAL | Status: DC

## 2013-06-15 MED ORDER — BENAZEPRIL HCL 20 MG PO TABS: 20.0000 mg | ORAL_TABLET | Freq: Every day | ORAL | Status: DC

## 2013-06-15 MED ORDER — ISOSORBIDE MONONITRATE ER 60 MG PO TB24: 60.0000 mg | ORAL_TABLET | Freq: Two times a day (BID) | ORAL | Status: DC

## 2013-06-15 MED ORDER — LORAZEPAM 1 MG PO TABS: ORAL_TABLET | ORAL | Status: DC

## 2013-06-15 MED ORDER — BENAZEPRIL HCL 20 MG PO TABS: 20.0000 mg | ORAL_TABLET | Freq: Two times a day (BID) | ORAL | Status: DC

## 2013-06-15 NOTE — Telephone Encounter (Signed)
New Problem  Pt would like someone to go over his meds with him. Please advise

## 2013-06-15 NOTE — Patient Instructions (Signed)
Your physician recommends that you schedule a follow-up appointment in: one year with Dr Swaziland  Your physician recommends that you continue on your current medications as directed. Please refer to the Current Medication list given to you today.

## 2013-06-15 NOTE — Telephone Encounter (Signed)
Returned call to patient he stated he is taking Lotensin 20 mg twice a day and on his after visit summary it was 20 mg daily.Patient was told we changed Lotensin to 20 mg twice a day.

## 2013-06-15 NOTE — Progress Notes (Signed)
   Darryl [name] Date of Birth: [birth year] Medical Record #[id number]  History of Present Illness: Darryl [name] is seen today for followup of his atrial fibrillation. He states he is feeling well. He denies any palpitations, dizziness, chest pain, or shortness of breath. He is taking his antihypertensive therapy and rate control therapy. He remains on aspirin 81 mg daily. He refuses anticoagulant therapy. He also refuses other preventative therapy such as statins.  Current Outpatient Prescriptions on File Prior to Visit  Medication Sig Dispense Refill  . aspirin 81 MG tablet Take 81 mg by mouth daily.        . pantoprazole (PROTONIX) 40 MG tablet Take 40 mg by mouth as needed.        No current facility-administered medications on file prior to visit.    Allergies  Allergen Reactions  . Verapamil Hcl Er     Past Medical History  Diagnosis Date  . Coronary artery disease   . LV dysfunction   . Hyperlipidemia   . Hypertension   . SVT (supraventricular tachycardia)   . GERD (gastroesophageal reflux disease)   . Atrial fibrillation     Past Surgical History  Procedure Laterality Date  . Coronary artery bypass graft  1993    SAPHENOUS VEIN GRAFT TO THE FIRST OBTUSE MARGINAL VESSEL, SAPHENOUS VEIN GRAFT TO THE SECOND  DIAGONAL, SAPHENOUS VEIN GRAFT TO THE FIRST DIAGONAL , AND LIMA GRAFT TO THE LAD  . Inguinal hernia repair    . Cardiac catheterization  04/10/2005    EF 45%. SEVERE 3 VESSEL OBSTRUCTIVE  CAD. PATENT LIMA GRAFT TO LAD. MILD TO MODERATE LV DYSFUNCTION. OCCLUSION OF THE VEIN GRAFT TO THE DIAGONIAL  . Cardiovascular stress test  03/31/2005    EF 43%. ISCHEMIA AND OR CAR IN THE ANTERIOAPICAL DISTRIBUTION. THERE IS A FIXED DEFECT IN THE INFEROBASAL REGION.    History  Smoking status  . Former Smoker  . Quit date: 06/05/1991  Smokeless tobacco  . Not on file    History  Alcohol Use No    Family History  Problem Relation Age of Onset  . Coronary artery disease Father   .  Heart disease Father   . Heart attack Father   . Heart disease Mother   . Heart attack Brother     Review of Systems: As noted in history of present illness..  All other systems were reviewed and are negative.  Physical Exam: BP 119/77  Pulse 85  Ht 5\' 5"  (1.651 m)  Wt 135 lb (61.236 kg)  BMI 22.47 kg/m2 He is an elderly white male in no acute distress. HEENT: Normal Neck: No JVD or bruits Cardiovascular: Irregular rate and rhythm. Normal S1 and S2. No gallop or murmur. Abdomen: Soft and nontender. No masses or bruits. Extremities: No cyanosis or edema. Pedal pulses are 2+. Skin: Dry Neuro: Alert and oriented x3. Cranial nerves II through XII are intact.  LABORATORY DATA: ECG today demonstrates atrial fibrillation with a ventricular response of 85 beats per minute. Old anterior septal infarct. Left axis deviation.  Assessment / Plan: 1. Atrial fibrillation-permanent. Rate is well controlled on atenolol. Patient refuses anticoagulation. Continue aspirin 81 mg daily.  2. Coronary disease. Status post CABG in 1993. Cardiac catheterization 2006 showed occlusion of the vein graft to the diagonal. Patient is asymptomatic on combination of atenolol and isosorbide. Continue aspirin.  3. Hypertension-well-controlled. Continue atenolol, benazepril, and HCTZ.  4. Hyperlipidemia. Patient refuses lipid-lowering therapy.

## 2013-06-20 ENCOUNTER — Telehealth: Payer: Self-pay | Admitting: Cardiology

## 2013-06-20 NOTE — Telephone Encounter (Signed)
Spoke to patient was told at 06/15/13 office visit with Dr.Jordan [place] Carbo RN was training with me 06/15/13 and she accidentally gave your discharge instructions to a different patient,your information was shredded by that patient.This was a Neurosurgeon.

## 2013-06-20 NOTE — Telephone Encounter (Signed)
New problem ° ° °Pt returning your call. °

## 2013-06-20 NOTE — Telephone Encounter (Signed)
Patient stated he was given his correct discharge instructions instructions on 06/15/13.

## 2013-06-26 ENCOUNTER — Ambulatory Visit: Payer: Medicare Other | Admitting: Physician Assistant

## 2013-06-27 ENCOUNTER — Encounter: Payer: Self-pay | Admitting: Family

## 2013-06-27 ENCOUNTER — Ambulatory Visit (INDEPENDENT_AMBULATORY_CARE_PROVIDER_SITE_OTHER): Payer: Medicare Other | Admitting: Family

## 2013-06-27 VITALS — BP 110/70 | HR 86 | Temp 98.0°F | Resp 16 | Ht 65.0 in | Wt 136.1 lb

## 2013-06-27 DIAGNOSIS — L03011 Cellulitis of right finger: Secondary | ICD-10-CM

## 2013-06-27 DIAGNOSIS — I4891 Unspecified atrial fibrillation: Secondary | ICD-10-CM

## 2013-06-27 DIAGNOSIS — I10 Essential (primary) hypertension: Secondary | ICD-10-CM

## 2013-06-27 DIAGNOSIS — IMO0002 Reserved for concepts with insufficient information to code with codable children: Secondary | ICD-10-CM | POA: Insufficient documentation

## 2013-06-27 DIAGNOSIS — I2510 Atherosclerotic heart disease of native coronary artery without angina pectoris: Secondary | ICD-10-CM

## 2013-06-27 DIAGNOSIS — E785 Hyperlipidemia, unspecified: Secondary | ICD-10-CM

## 2013-06-27 DIAGNOSIS — N400 Enlarged prostate without lower urinary tract symptoms: Secondary | ICD-10-CM

## 2013-06-27 MED ORDER — CEPHALEXIN 500 MG PO CAPS: 500.0000 mg | ORAL_CAPSULE | Freq: Two times a day (BID) | ORAL | Status: DC

## 2013-06-27 NOTE — Assessment & Plan Note (Signed)
Clinically stable.  Defer management to cardiology.

## 2013-06-27 NOTE — Assessment & Plan Note (Signed)
Declines statin or med therapy.

## 2013-06-27 NOTE — Assessment & Plan Note (Signed)
BP stable on current meds. Continue same.  

## 2013-06-27 NOTE — Assessment & Plan Note (Signed)
Rate stable. Has declined anticoagulation.  Management per cardiology.

## 2013-06-27 NOTE — Assessment & Plan Note (Signed)
Appears to be improving per pt history.  Rx with keflex and warm soaks bid. Follow up if worsening or no improvement

## 2013-06-27 NOTE — Patient Instructions (Addendum)
Soak your thumb twice daily  Start keflex. Call if symptoms worsen or if not improved in 1 week. Schedule a fasting medicare wellness in the next 3 months.

## 2013-06-27 NOTE — Progress Notes (Signed)
Subjective:    Patient ID: Darryl [name], male    DOB: 12-18-1927, 79 y.o.   MRN: [id number]  HPI  Darryl [name] is an 79 yr old male who present with complaint of pain in the right distal thumb.  Reports that symptoms started about 1 week ago.  Initially had swelling, drainage and pain.  Reports no longer draining and area is less tender.  Atrial fibrillation-follows with Dr. Swaziland cardiology.  Has declined anticoagulation.  Hyperlipidemia- has declined statin.  HTN- on atenolol and benazapril.   CAD- on aspirin.  Atenolol.  Imdur.  Denies chest pain.   GERD- use protonix prn. Reports symptoms well controlled.    Review of Systems  Constitutional: Negative for unexpected weight change.  HENT: Negative for hearing loss.   Eyes:       Wears glasses for reading  Respiratory: Negative for cough.   Cardiovascular: Negative for chest pain.  Gastrointestinal: Negative for diarrhea, constipation and blood in stool.  Genitourinary: Negative for dysuria.       Nocturia x 1 Reports hx of BPH  Skin:       Some dandruff along beardline  Neurological: Negative for headaches.  Hematological: Negative for adenopathy.  Psychiatric/Behavioral:       Denies depression   Past Medical History  Diagnosis Date  . Coronary artery disease   . LV dysfunction   . Hyperlipidemia   . Hypertension   . SVT (supraventricular tachycardia)   . GERD (gastroesophageal reflux disease)   . Atrial fibrillation     History   Social History  . Marital Status: Widowed    Spouse Name: N/A    Number of Children: 3  . Years of Education: N/A   Occupational History  . 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 Smoking status: Former Smoker    Quit date: 06/05/1991  . Smokeless tobacco: Not on file  . Alcohol Use: No  . Drug Use: No  . Sexual Activity: Not on file   Other Topics Concern  . Not on file   Social History Narrative   Wife passed 2011- lives alone in [place]   Has  daughter in [place]   One son in [place]   Daughter in [place]   Retired Programmer, applications   Completed 2 masters degrees- Lobbyist    G[place] up [place] came here at age 62- to attend [place] [employer].   Enjoys working on computers- skyping with his relatives, friends          Past Surgical History  Procedure Laterality Date  . Inguinal hernia repair    . Cardiac catheterization  04/10/2005    EF 45%. SEVERE 3 VESSEL OBSTRUCTIVE  CAD. PATENT LIMA GRAFT TO LAD. MILD TO MODERATE LV DYSFUNCTION. OCCLUSION OF THE VEIN GRAFT TO THE DIAGONIAL  . Cardiovascular stress test  03/31/2005    EF 43%. ISCHEMIA AND OR CAR IN THE ANTERIOAPICAL DISTRIBUTION. THERE IS A FIXED DEFECT IN THE INFEROBASAL REGION.  Marland Kitchen Coronary artery bypass graft  1993    SAPHENOUS VEIN GRAFT TO THE FIRST OBTUSE MARGINAL VESSEL, SAPHENOUS VEIN GRAFT TO THE SECOND  DIAGONAL, SAPHENOUS VEIN GRAFT TO THE FIRST DIAGONAL , AND LIMA GRAFT TO THE LAD    Family History  Problem Relation Age of Onset  . Coronary artery disease Father   . Heart disease Father   . Heart attack Father   . Heart disease Mother   .  Heart attack Brother     Allergies  Allergen Reactions  . Verapamil Hcl Er     Current Outpatient Prescriptions on File Prior to Visit  Medication Sig Dispense Refill  . aspirin 81 MG tablet Take 81 mg by mouth daily.        Marland Kitchen atenolol (TENORMIN) 50 MG tablet Take 1.5 tablets (75 mg total) by mouth daily.  145 tablet  3  . benazepril (LOTENSIN) 20 MG tablet Take 1 tablet (20 mg total) by mouth 2 (two) times daily.  180 tablet  3  . hydrochlorothiazide (MICROZIDE) 12.5 MG capsule Take 1 capsule (12.5 mg total) by mouth daily.  90 capsule  3  . isosorbide mononitrate (IMDUR) 60 MG 24 hr tablet Take 1 tablet (60 mg total) by mouth 2 (two) times daily.  90 tablet  3  . LORazepam (ATIVAN) 1 MG tablet Take 1 tablet (1 mg total) by mouth 3 (three) times daily.  90 tablet  3  . pantoprazole (PROTONIX) 40 MG  tablet Take 40 mg by mouth as needed.        No current facility-administered medications on file prior to visit.    BP 110/70  Pulse 86  Temp(Src) 98 F (36.7 C) (Oral)  Resp 16  Ht 5\' 5"  (1.651 m)  Wt 136 lb 1.3 oz (61.725 kg)  BMI 22.64 kg/m2  SpO2 97%       Objective:   Physical Exam  Constitutional: He is oriented to person, place, and time. He appears well-developed and well-nourished. No distress.  HENT:  Head: Normocephalic and atraumatic.  Cardiovascular: Normal rate and regular rhythm.   No murmur heard. Pulmonary/Chest: Effort normal and breath sounds normal. No respiratory distress. He has no wheezes. He has no rales. He exhibits no tenderness.  Neurological: He is alert and oriented to person, place, and time.  Skin:  Mild scabbing noted beneath distal right thumbnail, tender to palpation. No erythema, no swelling.   Psychiatric: He has a normal mood and affect. His behavior is normal. Judgment and thought content normal.          Assessment & Plan:

## 2013-06-27 NOTE — Progress Notes (Signed)
Pre visit review using our clinic review tool, if applicable. No additional management support is needed unless otherwise documented below in the visit note. 

## 2013-08-04 ENCOUNTER — Encounter: Payer: Medicare Other | Admitting: Family

## 2013-12-20 ENCOUNTER — Telehealth: Payer: Self-pay | Admitting: *Deleted

## 2013-12-20 MED ORDER — LORAZEPAM 1 MG PO TABS: 1.0000 mg | ORAL_TABLET | Freq: Three times a day (TID) | ORAL | Status: DC

## 2013-12-20 NOTE — Telephone Encounter (Signed)
Patient requests ativan refill be called into [facility] [place]. Thanks, MI

## 2014-05-04 ENCOUNTER — Encounter: Payer: Self-pay | Admitting: Physician Assistant

## 2014-05-04 ENCOUNTER — Ambulatory Visit (INDEPENDENT_AMBULATORY_CARE_PROVIDER_SITE_OTHER): Payer: Medicare HMO | Admitting: Physician Assistant

## 2014-05-04 ENCOUNTER — Ambulatory Visit (HOSPITAL_BASED_OUTPATIENT_CLINIC_OR_DEPARTMENT_OTHER)
Admission: RE | Admit: 2014-05-04 | Discharge: 2014-05-04 | Disposition: A | Discharge status: Home / Self Care | Payer: Medicare HMO | Source: Ambulatory Visit | Attending: Physician Assistant | Admitting: Physician Assistant

## 2014-05-04 VITALS — BP 134/81 | HR 85 | Temp 97.7°F | Resp 16 | Ht 65.0 in | Wt 130.2 lb

## 2014-05-04 DIAGNOSIS — I10 Essential (primary) hypertension: Secondary | ICD-10-CM

## 2014-05-04 DIAGNOSIS — I482 Chronic atrial fibrillation, unspecified: Secondary | ICD-10-CM

## 2014-05-04 DIAGNOSIS — Z136 Encounter for screening for cardiovascular disorders: Secondary | ICD-10-CM

## 2014-05-04 DIAGNOSIS — M25561 Pain in right knee: Secondary | ICD-10-CM | POA: Diagnosis not present

## 2014-05-04 DIAGNOSIS — G8929 Other chronic pain: Secondary | ICD-10-CM | POA: Diagnosis not present

## 2014-05-04 DIAGNOSIS — Z0000 Encounter for general adult medical examination without abnormal findings: Secondary | ICD-10-CM

## 2014-05-04 LAB — LIPID PANEL W/DIRECT LDL/HDL RATIO
CHOL/HDL RATIO: 3.9 ratio
Cholesterol: 197 mg/dL (ref 0–200)
Direct LDL: 123 mg/dL — ABNORMAL HIGH
HDL: 51 mg/dL (ref >39)
LDL/HDL RATIO (DIRECT LDL): 2.4 ratio
TRIGLYCERIDES: 82 mg/dL (ref <150)

## 2014-05-04 LAB — HEPATIC FUNCTION PANEL
ALK PHOS: 52 U/L (ref 39–117)
ALT: 6 U/L (ref 0–53)
AST: 18 U/L (ref 0–37)
Albumin: 3.7 g/dL (ref 3.5–5.2)
BILIRUBIN DIRECT: 0.1 mg/dL (ref 0.0–0.3)
Total Bilirubin: 0.8 mg/dL (ref 0.2–1.2)
Total Protein: 7.9 g/dL (ref 6.0–8.3)

## 2014-05-04 LAB — BASIC METABOLIC PANEL WITH GFR
BUN: 14 mg/dL (ref 6–23)
CO2: 29 meq/L (ref 19–32)
Calcium: 9.4 mg/dL (ref 8.4–10.5)
Chloride: 95 mEq/L — ABNORMAL LOW (ref 96–112)
Creat: 0.96 mg/dL (ref 0.50–1.35)
GFR, EST AFRICAN AMERICAN: 84 mL/min
GFR, Est Non African American: 73 mL/min
Glucose, Bld: 87 mg/dL (ref 70–99)
POTASSIUM: 3.6 meq/L (ref 3.5–5.3)
SODIUM: 135 meq/L (ref 135–145)

## 2014-05-04 LAB — URINALYSIS, ROUTINE W REFLEX MICROSCOPIC
BILIRUBIN URINE: NEGATIVE
HGB URINE DIPSTICK: NEGATIVE
KETONES UR: NEGATIVE
Leukocytes, UA: NEGATIVE
NITRITE: NEGATIVE
PH: 7 (ref 5.0–8.0)
Specific Gravity, Urine: 1.01 (ref 1.000–1.030)
Total Protein, Urine: NEGATIVE
Urine Glucose: NEGATIVE
Urobilinogen, UA: 0.2 (ref 0.0–1.0)

## 2014-05-04 LAB — CBC
HEMATOCRIT: 43.5 % (ref 39.0–52.0)
Hemoglobin: 14.2 g/dL (ref 13.0–17.0)
MCHC: 32.6 g/dL (ref 30.0–36.0)
MCV: 83.6 fl (ref 78.0–100.0)
Platelets: 214 10*3/uL (ref 150.0–400.0)
RBC: 5.21 Mil/uL (ref 4.22–5.81)
RDW: 14.4 % (ref 11.5–15.5)
WBC: 6.8 10*3/uL (ref 4.0–10.5)

## 2014-05-04 LAB — TSH: TSH: 1.19 u[IU]/mL (ref 0.35–4.50)

## 2014-05-04 NOTE — Patient Instructions (Signed)
Please continue medications as directed.  Increase fluid intake.  Eat a well-balanced diet.  I will call you with your results.  You will be contacted by the Urologist for assessment. I am deferring prostate testing to them because you are asymptomatic at present and you are 80 years of age.  Follow-up with Cardiology as scheduled.  Please go downstairs for an x-ray of your right knee to assess level of osteoarthritis.  Preventive Care for Adults A healthy lifestyle and preventive care can promote health and wellness. Preventive health guidelines for men include the following key practices:  A routine yearly physical is a good way to check with your health care provider about your health and preventative screening. It is a chance to share any concerns and updates on your health and to receive a thorough exam.  Visit your dentist for a routine exam and preventative care every 6 months. Brush your teeth twice a day and floss once a day. Good oral hygiene prevents tooth decay and gum disease.  The frequency of eye exams is based on your age, health, family medical history, use of contact lenses, and other factors. Follow your health care provider's recommendations for frequency of eye exams.  Eat a healthy diet. Foods such as vegetables, fruits, whole grains, low-fat dairy products, and lean protein foods contain the nutrients you need without too many calories. Decrease your intake of foods high in solid fats, added sugars, and salt. Eat the right amount of calories for you.Get information about a proper diet from your health care provider, if necessary.  Regular physical exercise is one of the most important things you can do for your health. Most adults should get at least 150 minutes of moderate-intensity exercise (any activity that increases your heart rate and causes you to sweat) each week. In addition, most adults need muscle-strengthening exercises on 2 or more days a week.  Maintain a  healthy weight. The body mass index (BMI) is a screening tool to identify possible weight problems. It provides an estimate of body fat based on height and weight. Your health care provider can find your BMI and can help you achieve or maintain a healthy weight.For adults 20 years and older:  A BMI below 18.5 is considered underweight.  A BMI of 18.5 to 24.9 is normal.  A BMI of 25 to 29.9 is considered overweight.  A BMI of 30 and above is considered obese.  Maintain normal blood lipids and cholesterol levels by exercising and minimizing your intake of saturated fat. Eat a balanced diet with plenty of fruit and vegetables. Blood tests for lipids and cholesterol should begin at age 33 and be repeated every 5 years. If your lipid or cholesterol levels are high, you are over 50, or you are at high risk for heart disease, you may need your cholesterol levels checked more frequently.Ongoing high lipid and cholesterol levels should be treated with medicines if diet and exercise are not working.  If you smoke, find out from your health care provider how to quit. If you do not use tobacco, do not start.  Lung cancer screening is recommended for adults aged 55-80 years who are at high risk for developing lung cancer because of a history of smoking. A yearly low-dose CT scan of the lungs is recommended for people who have at least a 30-pack-year history of smoking and are a current smoker or have quit within the past 15 years. A pack year of smoking is smoking an average  of 1 pack of cigarettes a day for 1 year (for example: 1 pack a day for 30 years or 2 packs a day for 15 years). Yearly screening should continue until the smoker has stopped smoking for at least 15 years. Yearly screening should be stopped for people who develop a health problem that would prevent them from having lung cancer treatment.  If you choose to drink alcohol, do not have more than 2 drinks per day. One drink is considered to be  12 ounces (355 mL) of beer, 5 ounces (148 mL) of wine, or 1.5 ounces (44 mL) of liquor.  Avoid use of street drugs. Do not share needles with anyone. Ask for help if you need support or instructions about stopping the use of drugs.  High blood pressure causes heart disease and increases the risk of stroke. Your blood pressure should be checked at least every 1-2 years. Ongoing high blood pressure should be treated with medicines, if weight loss and exercise are not effective.  If you are 60-57 years old, ask your health care provider if you should take aspirin to prevent heart disease.  Diabetes screening involves taking a blood sample to check your fasting blood sugar level. This should be done once every 3 years, after age 75, if you are within normal weight and without risk factors for diabetes. Testing should be considered at a younger age or be carried out more frequently if you are overweight and have at least 1 risk factor for diabetes.  Colorectal cancer can be detected and often prevented. Most routine colorectal cancer screening begins at the age of 34 and continues through age 52. However, your health care provider may recommend screening at an earlier age if you have risk factors for colon cancer. On a yearly basis, your health care provider may provide home test kits to check for hidden blood in the stool. Use of a small camera at the end of a tube to directly examine the colon (sigmoidoscopy or colonoscopy) can detect the earliest forms of colorectal cancer. Talk to your health care provider about this at age 66, when routine screening begins. Direct exam of the colon should be repeated every 5-10 years through age 80, unless early forms of precancerous polyps or small growths are found.  People who are at an increased risk for hepatitis B should be screened for this virus. You are considered at high risk for hepatitis B if:  You were born in a country where hepatitis B occurs often. Talk  with your health care provider about which countries are considered high risk.  Your parents were born in a high-risk country and you have not received a shot to protect against hepatitis B (hepatitis B vaccine).  You have HIV or AIDS.  You use needles to inject street drugs.  You live with, or have sex with, someone who has hepatitis B.  You are a man who has sex with other men (MSM).  You get hemodialysis treatment.  You take certain medicines for conditions such as cancer, organ transplantation, and autoimmune conditions.  Hepatitis C blood testing is recommended for all people born from [birth year] through 1963 and any individual with known risks for hepatitis C.  Practice safe sex. Use condoms and avoid high-risk sexual practices to reduce the spread of sexually transmitted infections (STIs). STIs include gonorrhea, chlamydia, syphilis, trichomonas, herpes, HPV, and human immunodeficiency virus (HIV). Herpes, HIV, and HPV are viral illnesses that have no cure. They can result in  disability, cancer, and death.  If you are at risk of being infected with HIV, it is recommended that you take a prescription medicine daily to prevent HIV infection. This is called preexposure prophylaxis (PrEP). You are considered at risk if:  You are a man who has sex with other men (MSM) and have other risk factors.  You are a heterosexual man, are sexually active, and are at increased risk for HIV infection.  You take drugs by injection.  You are sexually active with a partner who has HIV.  Talk with your health care provider about whether you are at high risk of being infected with HIV. If you choose to begin PrEP, you should first be tested for HIV. You should then be tested every 3 months for as long as you are taking PrEP.  A one-time screening for abdominal aortic aneurysm (AAA) and surgical repair of large AAAs by ultrasound are recommended for men ages 30 to 32 years who are current or former  smokers.  Healthy men should no longer receive prostate-specific antigen (PSA) blood tests as part of routine cancer screening. Talk with your health care provider about prostate cancer screening.  Testicular cancer screening is not recommended for adult males who have no symptoms. Screening includes self-exam, a health care provider exam, and other screening tests. Consult with your health care provider about any symptoms you have or any concerns you have about testicular cancer.  Use sunscreen. Apply sunscreen liberally and repeatedly throughout the day. You should seek shade when your shadow is shorter than you. Protect yourself by wearing long sleeves, pants, a wide-brimmed hat, and sunglasses year round, whenever you are outdoors.  Once a month, do a whole-body skin exam, using a mirror to look at the skin on your back. Tell your health care provider about new moles, moles that have irregular borders, moles that are larger than a pencil eraser, or moles that have changed in shape or color.  Stay current with required vaccines (immunizations).  Influenza vaccine. All adults should be immunized every year.  Tetanus, diphtheria, and acellular pertussis (Td, Tdap) vaccine. An adult who has not previously received Tdap or who does not know his vaccine status should receive 1 dose of Tdap. This initial dose should be followed by tetanus and diphtheria toxoids (Td) booster doses every 10 years. Adults with an unknown or incomplete history of completing a 3-dose immunization series with Td-containing vaccines should begin or complete a primary immunization series including a Tdap dose. Adults should receive a Td booster every 10 years.  Varicella vaccine. An adult without evidence of immunity to varicella should receive 2 doses or a second dose if he has previously received 1 dose.  Human papillomavirus (HPV) vaccine. Males aged 52-21 years who have not received the vaccine previously should receive  the 3-dose series. Males aged 22-26 years may be immunized. Immunization is recommended through the age of 18 years for any male who has sex with males and did not get any or all doses earlier. Immunization is recommended for any person with an immunocompromised condition through the age of 20 years if he did not get any or all doses earlier. During the 3-dose series, the second dose should be obtained 4-8 weeks after the first dose. The third dose should be obtained 24 weeks after the first dose and 16 weeks after the second dose.  Zoster vaccine. One dose is recommended for adults aged 45 years or older unless certain conditions are present.  Measles, mumps, and rubella (MMR) vaccine. Adults born before [birth year] generally are considered immune to measles and mumps. Adults born in [birth year] or later should have 1 or more doses of MMR vaccine unless there is a contraindication to the vaccine or there is laboratory evidence of immunity to each of the three diseases. A routine second dose of MMR vaccine should be obtained at least 28 days after the first dose for students attending postsecondary schools, health care workers, or international travelers. People who received inactivated measles vaccine or an unknown type of measles vaccine during 1963-1967 should receive 2 doses of MMR vaccine. People who received inactivated mumps vaccine or an unknown type of mumps vaccine before 1979 and are at high risk for mumps infection should consider immunization with 2 doses of MMR vaccine. Unvaccinated health care workers born before [birth year] who lack laboratory evidence of measles, mumps, or rubella immunity or laboratory confirmation of disease should consider measles and mumps immunization with 2 doses of MMR vaccine or rubella immunization with 1 dose of MMR vaccine.  Pneumococcal 13-valent conjugate (PCV13) vaccine. When indicated, a person who is uncertain of his immunization history and has no record of immunization should  receive the PCV13 vaccine. An adult aged 17 years or older who has certain medical conditions and has not been previously immunized should receive 1 dose of PCV13 vaccine. This PCV13 should be followed with a dose of pneumococcal polysaccharide (PPSV23) vaccine. The PPSV23 vaccine dose should be obtained at least 8 weeks after the dose of PCV13 vaccine. An adult aged 48 years or older who has certain medical conditions and previously received 1 or more doses of PPSV23 vaccine should receive 1 dose of PCV13. The PCV13 vaccine dose should be obtained 1 or more years after the last PPSV23 vaccine dose.  Pneumococcal polysaccharide (PPSV23) vaccine. When PCV13 is also indicated, PCV13 should be obtained first. All adults aged 12 years and older should be immunized. An adult younger than age 40 years who has certain medical conditions should be immunized. Any person who resides in a nursing home or long-term care facility should be immunized. An adult smoker should be immunized. People with an immunocompromised condition and certain other conditions should receive both PCV13 and PPSV23 vaccines. People with human immunodeficiency virus (HIV) infection should be immunized as soon as possible after diagnosis. Immunization during chemotherapy or radiation therapy should be avoided. Routine use of PPSV23 vaccine is not recommended for American Indians, [place] Natives, or people younger than 65 years unless there are medical conditions that require PPSV23 vaccine. When indicated, people who have unknown immunization and have no record of immunization should receive PPSV23 vaccine. One-time revaccination 5 years after the first dose of PPSV23 is recommended for people aged 19-64 years who have chronic kidney failure, nephrotic syndrome, asplenia, or immunocompromised conditions. People who received 1-2 doses of PPSV23 before age 37 years should receive another dose of PPSV23 vaccine at age 11 years or later if at least 5  years have passed since the previous dose. Doses of PPSV23 are not needed for people immunized with PPSV23 at or after age 14 years.  Meningococcal vaccine. Adults with asplenia or persistent complement component deficiencies should receive 2 doses of quadrivalent meningococcal conjugate (MenACWY-D) vaccine. The doses should be obtained at least 2 months apart. Microbiologists working with certain meningococcal bacteria, [employer], people at risk during an outbreak, and people who travel to or live in countries with a high rate of meningitis should be immunized.  A first-year college student up through age 41 years who is living in a residence hall should receive a dose if he did not receive a dose on or after his 16th birthday. Adults who have certain high-risk conditions should receive one or more doses of vaccine.  Hepatitis A vaccine. Adults who wish to be protected from this disease, have certain high-risk conditions, work with hepatitis A-infected animals, work in hepatitis A research labs, or travel to or work in countries with a high rate of hepatitis A should be immunized. Adults who were previously unvaccinated and who anticipate close contact with an international adoptee during the first 60 days after arrival in the [place] from a country with a high rate of hepatitis A should be immunized.  Hepatitis B vaccine. Adults should be immunized if they wish to be protected from this disease, have certain high-risk conditions, may be exposed to blood or other infectious body fluids, are household contacts or sex partners of hepatitis B positive people, are clients or workers in certain care facilities, or travel to or work in countries with a high rate of hepatitis B.  Haemophilus influenzae type b (Hib) vaccine. A previously unvaccinated person with asplenia or sickle cell disease or having a scheduled splenectomy should receive 1 dose of Hib vaccine. Regardless of previous immunization, a  recipient of a hematopoietic stem cell transplant should receive a 3-dose series 6-12 months after his successful transplant. Hib vaccine is not recommended for adults with HIV infection. Preventive Service / Frequency Ages 30 to 69  Blood pressure check.** / Every 1 to 2 years.  Lipid and cholesterol check.** / Every 5 years beginning at age 71.  Hepatitis C blood test.** / For any individual with known risks for hepatitis C.  Skin self-exam. / Monthly.  Influenza vaccine. / Every year.  Tetanus, diphtheria, and acellular pertussis (Tdap, Td) vaccine.** / Consult your health care provider. 1 dose of Td every 10 years.  Varicella vaccine.** / Consult your health care provider.  HPV vaccine. / 3 doses over 6 months, if 41 or younger.  Measles, mumps, rubella (MMR) vaccine.** / You need at least 1 dose of MMR if you were born in 1955 or later. You may also need a second dose.  Pneumococcal 13-valent conjugate (PCV13) vaccine.** / Consult your health care provider.  Pneumococcal polysaccharide (PPSV23) vaccine.** / 1 to 2 doses if you smoke cigarettes or if you have certain conditions.  Meningococcal vaccine.** / 1 dose if you are age 43 to 4 years and a Market researcher living in a residence hall, or have one of several medical conditions. You may also need additional booster doses.  Hepatitis A vaccine.** / Consult your health care provider.  Hepatitis B vaccine.** / Consult your health care provider.  Haemophilus influenzae type b (Hib) vaccine.** / Consult your health care provider. Ages 63 to 52  Blood pressure check.** / Every 1 to 2 years.  Lipid and cholesterol check.** / Every 5 years beginning at age 30.  Lung cancer screening. / Every year if you are aged 51-80 years and have a 30-pack-year history of smoking and currently smoke or have quit within the past 15 years. Yearly screening is stopped once you have quit smoking for at least 15 years or develop a  health problem that would prevent you from having lung cancer treatment.  Fecal occult blood test (FOBT) of stool. / Every year beginning at age 55 and continuing until age 12. You may  not have to do this test if you get a colonoscopy every 10 years.  Flexible sigmoidoscopy** or colonoscopy.** / Every 5 years for a flexible sigmoidoscopy or every 10 years for a colonoscopy beginning at age 47 and continuing until age 9.  Hepatitis C blood test.** / For all people born from [birth year] through 1963 and any individual with known risks for hepatitis C.  Skin self-exam. / Monthly.  Influenza vaccine. / Every year.  Tetanus, diphtheria, and acellular pertussis (Tdap/Td) vaccine.** / Consult your health care provider. 1 dose of Td every 10 years.  Varicella vaccine.** / Consult your health care provider.  Zoster vaccine.** / 1 dose for adults aged 51 years or older.  Measles, mumps, rubella (MMR) vaccine.** / You need at least 1 dose of MMR if you were born in 1955 or later. You may also need a second dose.  Pneumococcal 13-valent conjugate (PCV13) vaccine.** / Consult your health care provider.  Pneumococcal polysaccharide (PPSV23) vaccine.** / 1 to 2 doses if you smoke cigarettes or if you have certain conditions.  Meningococcal vaccine.** / Consult your health care provider.  Hepatitis A vaccine.** / Consult your health care provider.  Hepatitis B vaccine.** / Consult your health care provider.  Haemophilus influenzae type b (Hib) vaccine.** / Consult your health care provider. Ages 31 and over  Blood pressure check.** / Every 1 to 2 years.  Lipid and cholesterol check.**/ Every 5 years beginning at age 10.  Lung cancer screening. / Every year if you are aged 81-80 years and have a 30-pack-year history of smoking and currently smoke or have quit within the past 15 years. Yearly screening is stopped once you have quit smoking for at least 15 years or develop a health problem that would  prevent you from having lung cancer treatment.  Fecal occult blood test (FOBT) of stool. / Every year beginning at age 76 and continuing until age 5. You may not have to do this test if you get a colonoscopy every 10 years.  Flexible sigmoidoscopy** or colonoscopy.** / Every 5 years for a flexible sigmoidoscopy or every 10 years for a colonoscopy beginning at age 53 and continuing until age 88.  Hepatitis C blood test.** / For all people born from [birth year] through 1963 and any individual with known risks for hepatitis C.  Abdominal aortic aneurysm (AAA) screening.** / A one-time screening for ages 54 to 54 years who are current or former smokers.  Skin self-exam. / Monthly.  Influenza vaccine. / Every year.  Tetanus, diphtheria, and acellular pertussis (Tdap/Td) vaccine.** / 1 dose of Td every 10 years.  Varicella vaccine.** / Consult your health care provider.  Zoster vaccine.** / 1 dose for adults aged 28 years or older.  Pneumococcal 13-valent conjugate (PCV13) vaccine.** / Consult your health care provider.  Pneumococcal polysaccharide (PPSV23) vaccine.** / 1 dose for all adults aged 48 years and older.  Meningococcal vaccine.** / Consult your health care provider.  Hepatitis A vaccine.** / Consult your health care provider.  Hepatitis B vaccine.** / Consult your health care provider.  Haemophilus influenzae type b (Hib) vaccine.** / Consult your health care provider. **Family history and personal history of risk and conditions may change your health care provider's recommendations. Document Released: 09/08/2001 Document Revised: 07/18/2013 Document Reviewed: 12/08/2010 [facility] Patient Information 2015 [place], [place]. This information is not intended to replace advice given to you by your health care provider. Make sure you discuss any questions you have with your health care provider.

## 2014-05-04 NOTE — Progress Notes (Signed)
SUBJECTIVE: Patient presents to clinic today for Annual Medicare Wellness examination and management of other chronic issues.  Patient Risk Factors: Atrial Fibrillation, CAD, HTN, Hyperlipidemia  Roster of Physicians Providing Medical Care to Patient: Waldon MerlWilliam C. Adreyan Carbajal, PA-C -- Primary Care Dr. SwazilandJordan -- Cardiology  Chronic Medical Problems: Chronic Atrial Fibrillation -- Rate controlled.  Followed by Cardiology (Dr. SwazilandJordan). Is currently on 81 mg ASA BID as recommended by Cardiology.  Refuses Xarelto and Coumadin.  Sees Cardiology once per year.  Denies hx of DVT or PE.  EKG to be performed today.  Denies chest pain, palpitations or SOB.  Coronary Artery Disease -- CABG in 1993.  ASA 81 mg BID.  Followed by Cardiology.  Hypertension -- Currently on Atenolol, Benazepril, HCTZ and Imdur.  Asymptomatic.  BP 134/81 in clinic today.  Hyperlipidemia -- Declines medication.   GERD -- well-controlled with occasional Protonix.  Denies abdominal pain, nausea or vomiting.  BPH -- endorses history.  Previously followed by Urology. Not currently on any medication.  Nocturia x 2.  Endorses some hesitancy but overall good stream.  Denies incomplete bladder emptying.  Requests Referral to [facility].  Health Maintenance: Dental -- up-to-date Vision --up-to-date Immunizations -- Fluzone September 19th.  Declines shingles vaccination. Colonoscopy -- Last in 2005. Denies abnormal finding. No need for repeat testing giving age.  Activities of Daily Living: In your present state of health, do you have any difficulty performing the following activities? (1) Preparing food and eating?: No  (2) Bathing yourself: No  (3) Getting dressed: No  (4) Using the toilet: No  (5) Moving around from place to place: No  (6) In the past year have you fallen or had a near fall?: No     Home Safety:  Has smoke detector and wears seat belts. No firearms. No excess sun exposure.  Diet and Exercise:    Current exercise habits: walks occasionally. Dietary issues discussed: Patient has a well-balanced and overall healthy diet   PHQ-9 Depression Screen:  (Note: if answer to either of the following is "Yes", then a more complete depression screening is indicated)  Q1: Over the past two weeks, have you felt down, depressed or hopeless?no  Q2: Over the past two weeks, have you felt little interest or pleasure in doing things? no   The following portions of the patient's history were reviewed and updated as appropriate: allergies, current medications, past family history, past medical history, past social history, past surgical history and problem list.  OBJECTIVE:  BP 134/81  Pulse 85  Temp(Src) 97.7 F (36.5 C) (Oral)  Resp 16  Ht 5\' 5"  (1.651 m)  Wt 130 lb 4 oz (59.081 kg)  BMI 21.67 kg/m2  SpO2 97%  General Appearance:    Alert, cooperative, no distress, appears stated age  Head:    Normocephalic, without obvious abnormality, atraumatic  Eyes:    PERRL, conjunctiva/corneas clear, EOM's intact, fundi    benign, both eyes  Ears:    Normal TM's and external ear canals, both ears  Nose:   Nares normal, septum midline, mucosa normal, no drainage    or sinus tenderness  Throat:   Lips, mucosa, and tongue normal; teeth and gums normal  Neck:   Supple, symmetrical, trachea midline, no adenopathy;    thyroid:  no enlargement/tenderness/nodules; no carotid   bruit or JVD  Back:     Symmetric, no curvature, ROM normal, no CVA tenderness  Lungs:     Clear to auscultation bilaterally,  respirations unlabored  Chest Wall:    No tenderness or deformity   Heart:    Irregularly irregular rate and rhythm consistent with chronic atrial fibrillation, S1 and S2 normal, no murmur, rub   or gallop  Abdomen:     Soft, non-tender, bowel sounds active all four quadrants,    no masses, no organomegaly  Genitalia:    Normal without lesion, discharge or tenderness  Rectal:    Normal tone, normal  prostate, no masses or tenderness;   guaiac negative stool  Extremities:   Extremities normal, atraumatic, no cyanosis or edema  Pulses:   2+ and symmetric all extremities  Skin:   Skin color, texture, turgor normal, no rashes or lesions  Lymph nodes:   Cervical, supraclavicular, and axillary nodes normal  Neurologic:   CNII-XII intact, normal strength, sensation and reflexes    throughout   Vision: see nursing report. Hearing: able to hear forced whisper at 6 feet Body mass index: Body mass index is 21.67 kg/(m^2). Cognitive Impairment Assessment: cognition, memory and judgment appear normal.   Assessment/Plan: Hypertension Well controlled at present.  Asymptomatic. EKG reveals chronic atrial fibrillation, rate controlled, without new/acute findings. Continue current regimen.   Atrial fibrillation Chronic.  Rate controlled.  Patient still refusing anticoagulation.  Continue ASA.  Chronic knee pain Likely mild OA.  Will obtain x-ray to further assess giving patient's concern.  PE unremarkable.  Medicare annual wellness visit, subsequent Immunizations discussed.  Fluzone up-to-date.  Declines Zostavax.  Discussed cessation of colonoscopy due to age and hx of normal colonoscopy.  Will defer prostate cancer screening to Urology but do not feel this is indicated giving age and comorbidities. Will obtain fasting labs today.  Screening for ischemic heart disease EKG reveals atrial fibrillation (chronic) with good ventricular rate control.  Continue current regimen.  Discussed anticoagulation, but patient still refusing.  Follow-up with Cardiology as scheduled.    A written set of instructions was given to the patient at the end of this visit.

## 2014-05-04 NOTE — Progress Notes (Signed)
Pre visit review using our clinic review tool, if applicable. No additional management support is needed unless otherwise documented below in the visit note/SLS  

## 2014-05-05 DIAGNOSIS — Z136 Encounter for screening for cardiovascular disorders: Secondary | ICD-10-CM | POA: Insufficient documentation

## 2014-05-05 DIAGNOSIS — M25569 Pain in unspecified knee: Secondary | ICD-10-CM

## 2014-05-05 DIAGNOSIS — Z0000 Encounter for general adult medical examination without abnormal findings: Secondary | ICD-10-CM | POA: Insufficient documentation

## 2014-05-05 DIAGNOSIS — G8929 Other chronic pain: Secondary | ICD-10-CM | POA: Insufficient documentation

## 2014-05-05 NOTE — Assessment & Plan Note (Signed)
EKG reveals atrial fibrillation (chronic) with good ventricular rate control.  Continue current regimen.  Discussed anticoagulation, but patient still refusing.  Follow-up with Cardiology as scheduled.

## 2014-05-05 NOTE — Assessment & Plan Note (Signed)
Chronic.  Rate controlled.  Patient still refusing anticoagulation.  Continue ASA.

## 2014-05-05 NOTE — Assessment & Plan Note (Signed)
Immunizations discussed.  Fluzone up-to-date.  Declines Zostavax.  Discussed cessation of colonoscopy due to age and hx of normal colonoscopy.  Will defer prostate cancer screening to Urology but do not feel this is indicated giving age and comorbidities. Will obtain fasting labs today.

## 2014-05-05 NOTE — Assessment & Plan Note (Signed)
Likely mild OA.  Will obtain x-ray to further assess giving patient's concern.  PE unremarkable.

## 2014-05-05 NOTE — Assessment & Plan Note (Signed)
Well controlled at present.  Asymptomatic. EKG reveals chronic atrial fibrillation, rate controlled, without new/acute findings. Continue current regimen.

## 2014-05-14 ENCOUNTER — Telehealth: Payer: Self-pay | Admitting: Physician Assistant

## 2014-05-14 NOTE — Telephone Encounter (Signed)
I spoke with patient on 10.12.15 about lab results: Notes Recorded by Regis BillSharon L Scates, CMA on 05/07/2014 at 5:12 PM  Patient informed, understood & agreed; would like to wait on Ortho Referral/SLS  Notes Recorded by Waldon MerlWilliam C Martin, PA-C on 05/05/2014 at 2:08 AM Labs look good. Follow-up with Cardiology as scheduled. Again, he will be contacted by Urology. See Imaging results -- patient with x-ray right knee showing mild osteoarthritis but no other abnormality. Recommend he let us set him up with an Orthopedist if he still wishes to proceed with further workup  Are there any other notes that I am unaware of.?/SLS

## 2014-05-14 NOTE — Telephone Encounter (Signed)
Caller name: Jonny Ruizjohn Relation to pt: self Call back number: [phone] Pharmacy:  Reason for call:   Patient is requesting last lab results.

## 2014-05-14 NOTE — Telephone Encounter (Signed)
Please call patient again to make sure he has no further concerns.

## 2014-05-21 ENCOUNTER — Encounter: Payer: Self-pay | Admitting: *Deleted

## 2014-05-21 NOTE — Telephone Encounter (Signed)
Letter Mailed/sls 

## 2014-05-24 ENCOUNTER — Telehealth: Payer: Self-pay | Admitting: Physician Assistant

## 2014-05-24 NOTE — Telephone Encounter (Signed)
Labs printed and mailed to Pt as requested.

## 2014-05-24 NOTE — Telephone Encounter (Signed)
Pt is also wanting the complete lab results mailed to him as well.  He keeps them for his records.

## 2014-05-24 NOTE — Telephone Encounter (Signed)
A user error has taken place.

## 2014-05-29 ENCOUNTER — Telehealth: Payer: Self-pay | Admitting: Cardiology

## 2014-05-29 NOTE — Telephone Encounter (Signed)
Please call refill for Lorazepam 1 mg to [facility] on [address] [place], [place].  Patient states [facility] was going to fax request to us.

## 2014-05-30 ENCOUNTER — Other Ambulatory Visit: Payer: Self-pay | Admitting: Physician Assistant

## 2014-05-30 ENCOUNTER — Telehealth: Payer: Self-pay | Admitting: Physician Assistant

## 2014-05-30 MED ORDER — LORAZEPAM 1 MG PO TABS: 1.0000 mg | ORAL_TABLET | Freq: Three times a day (TID) | ORAL | Status: AC

## 2014-05-30 NOTE — Telephone Encounter (Signed)
Patient contacted the after hour service regarding med refill. Record shows Dr. SwazilandJordan just authorized refill of his Ativan to [facility] today.  Ramond DialSigned, Drako Maese PA Pager: [phone]

## 2014-05-30 NOTE — Telephone Encounter (Signed)
Please call,concerning his medicine. °

## 2014-05-30 NOTE — Telephone Encounter (Signed)
Returned call to patient he stated he needed lorazepam refill sent to [facility].Refill called in to pharmacy.Advised to keep appointment with Dr.Jordan 06/19/14 at 3:15 pm.

## 2014-06-19 ENCOUNTER — Ambulatory Visit (INDEPENDENT_AMBULATORY_CARE_PROVIDER_SITE_OTHER): Payer: Medicare HMO | Admitting: Cardiology

## 2014-06-19 ENCOUNTER — Encounter: Payer: Self-pay | Admitting: Cardiology

## 2014-06-19 VITALS — BP 130/90 | HR 82 | Ht 65.0 in | Wt 131.7 lb

## 2014-06-19 DIAGNOSIS — I2510 Atherosclerotic heart disease of native coronary artery without angina pectoris: Secondary | ICD-10-CM

## 2014-06-19 DIAGNOSIS — I482 Chronic atrial fibrillation, unspecified: Secondary | ICD-10-CM

## 2014-06-19 DIAGNOSIS — E785 Hyperlipidemia, unspecified: Secondary | ICD-10-CM

## 2014-06-19 DIAGNOSIS — I10 Essential (primary) hypertension: Secondary | ICD-10-CM

## 2014-06-19 MED ORDER — HYDROCHLOROTHIAZIDE 12.5 MG PO CAPS
12.5000 mg | ORAL_CAPSULE | Freq: Every day | ORAL | Status: AC
Start: 2014-06-19 — End: ?

## 2014-06-19 MED ORDER — ATENOLOL 50 MG PO TABS: 75.0000 mg | ORAL_TABLET | Freq: Every day | ORAL | Status: AC

## 2014-06-19 MED ORDER — BENAZEPRIL HCL 20 MG PO TABS: 20.0000 mg | ORAL_TABLET | Freq: Two times a day (BID) | ORAL | Status: AC

## 2014-06-19 MED ORDER — POLYETHYLENE GLYCOL 3350 17 G PO PACK: 17.0000 g | PACK | Freq: Every day | ORAL | Status: AC

## 2014-06-19 MED ORDER — CO Q 10 100 MG PO CAPS: 1.0000 | ORAL_CAPSULE | Freq: Every day | ORAL | Status: AC

## 2014-06-19 MED ORDER — BOOST HIGH PROTEIN PO LIQD: ORAL | Status: AC

## 2014-06-19 MED ORDER — PANTOPRAZOLE SODIUM 40 MG PO TBEC: 40.0000 mg | DELAYED_RELEASE_TABLET | ORAL | Status: AC | PRN

## 2014-06-19 MED ORDER — ISOSORBIDE MONONITRATE ER 60 MG PO TB24: 60.0000 mg | ORAL_TABLET | Freq: Two times a day (BID) | ORAL | Status: AC

## 2014-06-19 NOTE — Progress Notes (Signed)
Leverne HumblesJohn A [name] Date of Birth: Sep 02, 1927 Medical Record #[id number]  History of Present Illness: Darryl RuizJohn is seen today for followup of his atrial fibrillation. He has a history of CAD, atrial fibrillation, HTN, and hyperlipidemia. He is s/p CABG in 1993. Cardiac cath in 2006 showed occlusion of SVG to the diagonal. He has been treated medically. He has refused anticoagulation. (His wife died of complications from GI bleed on coumadin). He states he is feeling well. He denies any palpitations, dizziness, chest pain, or shortness of breath. He is taking his antihypertensive therapy and rate control therapy. He remains on aspirin 81 mg daily. His diet is poor and he usually eats out or has TV dinners. He is very sedentary and spends much of the day on his computer.   Current Outpatient Prescriptions on File Prior to Visit  Medication Sig Dispense Refill  . aspirin 81 MG tablet Take 81 mg by mouth daily.      . Cyanocobalamin (VITAMIN B-12) 1000 MCG SUBL Place under the tongue daily.    Marland Kitchen. LORazepam (ATIVAN) 1 MG tablet Take 1 tablet (1 mg total) by mouth 3 (three) times daily. 270 tablet 3  . Nutritional Supplements (PROTEIN SUPPLEMENT 80% PO) Take 30 mg by mouth daily.     No current facility-administered medications on file prior to visit.    Allergies  Allergen Reactions  . Verapamil Hcl Er     Past Medical History  Diagnosis Date  . Coronary artery disease   . LV dysfunction   . Hyperlipidemia   . Hypertension   . SVT (supraventricular tachycardia)   . GERD (gastroesophageal reflux disease)   . Atrial fibrillation     Past Surgical History  Procedure Laterality Date  . Inguinal hernia repair    . Cardiac catheterization  04/10/2005    EF 45%. SEVERE 3 VESSEL OBSTRUCTIVE  CAD. PATENT LIMA GRAFT TO LAD. MILD TO MODERATE LV DYSFUNCTION. OCCLUSION OF THE VEIN GRAFT TO THE DIAGONIAL  . Cardiovascular stress test  03/31/2005    EF 43%. ISCHEMIA AND OR CAR IN THE ANTERIOAPICAL  DISTRIBUTION. THERE IS A FIXED DEFECT IN THE INFEROBASAL REGION.  Marland Kitchen. Coronary artery bypass graft  1993    SAPHENOUS VEIN GRAFT TO THE FIRST OBTUSE MARGINAL VESSEL, SAPHENOUS VEIN GRAFT TO THE SECOND  DIAGONAL, SAPHENOUS VEIN GRAFT TO THE FIRST DIAGONAL , AND LIMA GRAFT TO THE LAD    History  Smoking status  . Former Smoker  . Quit date: 06/05/1991  Smokeless tobacco  . Never Used    History  Alcohol Use No    Family History  Problem Relation Age of Onset  . Coronary artery disease Father   . Heart disease Father   . Heart attack Father   . Heart disease Mother   . Heart attack Brother     Review of Systems: As noted in history of present illness..  All other systems were reviewed and are negative.  Physical Exam: BP 130/90 mmHg  Pulse 82  Ht 5\' 5"  (1.651 m)  Wt 131 lb 11.2 oz (59.739 kg)  BMI 21.92 kg/m2 He is an elderly white male in no acute distress. HEENT: Normal Neck: No JVD or bruits Cardiovascular: Irregular rate and rhythm. Normal S1 and S2. No gallop or murmur. Abdomen: Soft and nontender. No masses or bruits. Extremities: No cyanosis or edema. Pedal pulses are 2+. Skin: Dry Neuro: Alert and oriented x3. Cranial nerves II through XII are intact.  LABORATORY DATA: ECG today demonstrates  atrial fibrillation with a ventricular response of 82 beats per minute. Old anterior septal infarct. Left axis deviation. No acute change. I have personally reviewed and interpreted this study.  Lab Results  Component Value Date   WBC 6.8 05/04/2014   HGB 14.2 05/04/2014   HCT 43.5 05/04/2014   PLT 214.0 05/04/2014   GLUCOSE 87 05/04/2014   CHOL 198 06/16/2011   TRIG 82 05/04/2014   HDL 51 05/04/2014   LDLDIRECT 123* 05/04/2014   LDLCALC 125* 06/16/2011   ALT 6 05/04/2014   AST 18 05/04/2014   NA 135 05/04/2014   K 3.6 05/04/2014   CL 95* 05/04/2014   CREATININE 0.96 05/04/2014   BUN 14 05/04/2014   CO2 29 05/04/2014   TSH 1.19 05/04/2014     Assessment /  Plan: 1. Atrial fibrillation-permanent. Rate is well controlled on atenolol. Patient refuses anticoagulation. Continue aspirin 81 mg daily.  2. Coronary disease. Status post CABG in 1993. Cardiac catheterization 2006 showed occlusion of the vein graft to the diagonal. Patient is asymptomatic on combination of atenolol and isosorbide. Continue aspirin.  3. Hypertension-well-controlled. Continue atenolol, benazepril, and HCTZ.  4. Hyperlipidemia. Patient refuses lipid-lowering therapy.  I will follow up in one year.

## 2014-06-19 NOTE — Patient Instructions (Signed)
Continue your current therapy  I will see you in 1 year.   

## 2014-07-05 ENCOUNTER — Telehealth: Payer: Self-pay | Admitting: Physician Assistant

## 2014-07-05 DIAGNOSIS — N400 Enlarged prostate without lower urinary tract symptoms: Secondary | ICD-10-CM

## 2014-07-05 NOTE — Telephone Encounter (Signed)
Referral placed. My apologies.

## 2014-07-05 NOTE — Telephone Encounter (Signed)
Caller name: Jonny Ruizjohn Relation to pt: self Call back number: [phone] Pharmacy:  Reason for call:   Patient thought that Selena BattenCody was going to refer him to a urologist, but I do not see any referral in system.   Patient states that he made an appointment himself since he did not hear from us. He has an appointment with [facility] Berneice [facility]richManny [facility] on 07/12/14. He still needs a referral though.

## 2014-07-06 ENCOUNTER — Telehealth: Payer: Self-pay | Admitting: Physician Assistant

## 2014-07-06 NOTE — Telephone Encounter (Signed)
Error/gd °

## 2014-10-08 ENCOUNTER — Encounter (HOSPITAL_BASED_OUTPATIENT_CLINIC_OR_DEPARTMENT_OTHER): Payer: Self-pay | Admitting: Emergency Medicine

## 2014-10-08 ENCOUNTER — Emergency Department (HOSPITAL_BASED_OUTPATIENT_CLINIC_OR_DEPARTMENT_OTHER): Payer: Medicare HMO

## 2014-10-08 ENCOUNTER — Emergency Department (HOSPITAL_BASED_OUTPATIENT_CLINIC_OR_DEPARTMENT_OTHER)
Admission: EM | Admit: 2014-10-08 | Discharge: 2014-10-08 | Disposition: A | Discharge status: Home / Self Care | Payer: Medicare HMO | Attending: Emergency Medicine | Admitting: Emergency Medicine

## 2014-10-08 DIAGNOSIS — I2510 Atherosclerotic heart disease of native coronary artery without angina pectoris: Secondary | ICD-10-CM | POA: Insufficient documentation

## 2014-10-08 DIAGNOSIS — Y9389 Activity, other specified: Secondary | ICD-10-CM | POA: Insufficient documentation

## 2014-10-08 DIAGNOSIS — R52 Pain, unspecified: Secondary | ICD-10-CM

## 2014-10-08 DIAGNOSIS — W19XXXA Unspecified fall, initial encounter: Secondary | ICD-10-CM

## 2014-10-08 DIAGNOSIS — K5900 Constipation, unspecified: Secondary | ICD-10-CM | POA: Insufficient documentation

## 2014-10-08 DIAGNOSIS — Y998 Other external cause status: Secondary | ICD-10-CM | POA: Diagnosis not present

## 2014-10-08 DIAGNOSIS — S79922A Unspecified injury of left thigh, initial encounter: Secondary | ICD-10-CM | POA: Insufficient documentation

## 2014-10-08 DIAGNOSIS — I10 Essential (primary) hypertension: Secondary | ICD-10-CM | POA: Diagnosis not present

## 2014-10-08 DIAGNOSIS — Z7982 Long term (current) use of aspirin: Secondary | ICD-10-CM | POA: Insufficient documentation

## 2014-10-08 DIAGNOSIS — Z951 Presence of aortocoronary bypass graft: Secondary | ICD-10-CM | POA: Insufficient documentation

## 2014-10-08 DIAGNOSIS — K219 Gastro-esophageal reflux disease without esophagitis: Secondary | ICD-10-CM | POA: Diagnosis not present

## 2014-10-08 DIAGNOSIS — Z87891 Personal history of nicotine dependence: Secondary | ICD-10-CM | POA: Diagnosis not present

## 2014-10-08 DIAGNOSIS — I519 Heart disease, unspecified: Secondary | ICD-10-CM | POA: Insufficient documentation

## 2014-10-08 DIAGNOSIS — W1830XA Fall on same level, unspecified, initial encounter: Secondary | ICD-10-CM | POA: Diagnosis not present

## 2014-10-08 DIAGNOSIS — Z9889 Other specified postprocedural states: Secondary | ICD-10-CM | POA: Insufficient documentation

## 2014-10-08 DIAGNOSIS — M79605 Pain in left leg: Secondary | ICD-10-CM

## 2014-10-08 DIAGNOSIS — I471 Supraventricular tachycardia: Secondary | ICD-10-CM | POA: Insufficient documentation

## 2014-10-08 DIAGNOSIS — Z8639 Personal history of other endocrine, nutritional and metabolic disease: Secondary | ICD-10-CM | POA: Insufficient documentation

## 2014-10-08 DIAGNOSIS — Y9289 Other specified places as the place of occurrence of the external cause: Secondary | ICD-10-CM | POA: Insufficient documentation

## 2014-10-08 DIAGNOSIS — Z79899 Other long term (current) drug therapy: Secondary | ICD-10-CM | POA: Insufficient documentation

## 2014-10-08 LAB — BASIC METABOLIC PANEL
Anion gap: 12 (ref 5–15)
BUN: 25 mg/dL — AB (ref 6–23)
CALCIUM: 9.2 mg/dL (ref 8.4–10.5)
CO2: 28 mmol/L (ref 19–32)
CREATININE: 1.01 mg/dL (ref 0.50–1.35)
Chloride: 95 mmol/L — ABNORMAL LOW (ref 96–112)
GFR calc Af Amer: 77 mL/min — ABNORMAL LOW (ref >90)
GFR, EST NON AFRICAN AMERICAN: 66 mL/min — AB (ref >90)
GLUCOSE: 90 mg/dL (ref 70–99)
Potassium: 3.3 mmol/L — ABNORMAL LOW (ref 3.5–5.1)
SODIUM: 135 mmol/L (ref 135–145)

## 2014-10-08 LAB — CBC WITH DIFFERENTIAL/PLATELET
BASOS PCT: 0 % (ref 0–1)
Basophils Absolute: 0 10*3/uL (ref 0.0–0.1)
EOS ABS: 0.1 10*3/uL (ref 0.0–0.7)
Eosinophils Relative: 1 % (ref 0–5)
HEMATOCRIT: 40.9 % (ref 39.0–52.0)
Hemoglobin: 14.1 g/dL (ref 13.0–17.0)
Lymphocytes Relative: 21 % (ref 12–46)
Lymphs Abs: 1.6 10*3/uL (ref 0.7–4.0)
MCH: 28 pg (ref 26.0–34.0)
MCHC: 34.5 g/dL (ref 30.0–36.0)
MCV: 81.3 fL (ref 78.0–100.0)
MONO ABS: 0.8 10*3/uL (ref 0.1–1.0)
Monocytes Relative: 10 % (ref 3–12)
NEUTROS ABS: 5.4 10*3/uL (ref 1.7–7.7)
Neutrophils Relative %: 68 % (ref 43–77)
Platelets: 217 10*3/uL (ref 150–400)
RBC: 5.03 MIL/uL (ref 4.22–5.81)
RDW: 14.1 % (ref 11.5–15.5)
WBC: 7.9 10*3/uL (ref 4.0–10.5)

## 2014-10-08 LAB — URINALYSIS, ROUTINE W REFLEX MICROSCOPIC
Bilirubin Urine: NEGATIVE
GLUCOSE, UA: NEGATIVE mg/dL
HGB URINE DIPSTICK: NEGATIVE
Ketones, ur: 15 mg/dL — AB
Leukocytes, UA: NEGATIVE
Nitrite: NEGATIVE
PH: 5.5 (ref 5.0–8.0)
Protein, ur: NEGATIVE mg/dL
Specific Gravity, Urine: 1.019 (ref 1.005–1.030)
UROBILINOGEN UA: 1 mg/dL (ref 0.0–1.0)

## 2014-10-08 LAB — CK: Total CK: 123 U/L (ref 7–232)

## 2014-10-08 LAB — TROPONIN I: Troponin I: 0.03 ng/mL (ref <0.031)

## 2014-10-08 MED ORDER — NAPROXEN 500 MG PO TABS: 500.0000 mg | ORAL_TABLET | Freq: Two times a day (BID) | ORAL | Status: AC

## 2014-10-08 MED ORDER — HYDROCODONE-ACETAMINOPHEN 5-325 MG PO TABS: 1.0000 | ORAL_TABLET | ORAL | Status: AC | PRN

## 2014-10-08 MED ORDER — HYDROCODONE-ACETAMINOPHEN 5-325 MG PO TABS
1.0000 | ORAL_TABLET | Freq: Once | ORAL | Status: AC
  Administered 2014-10-08: 1 via ORAL
  Filled 2014-10-08: qty 1

## 2014-10-08 NOTE — ED Notes (Signed)
Pt returned from xray

## 2014-10-08 NOTE — ED Provider Notes (Signed)
CSN: [id number]     Arrival date & time 10/08/14  1620 History  This chart was scribed for Glynn Octave, MD by Freida Busman, ED Scribe. This patient was seen in room MH09/MH09 and the patient's care was started 7:02 PM.   Chief Complaint  Patient presents with  . Leg Injury    The history is provided by the patient and a relative. No language interpreter was used.     HPI Comments:  Darryl [name] is a 81 y.o. male who presents to the Emergency Department complaining of moderate LLE pain following fall 4 weeks ago. Pt states he landed on his left side/hip and a few days after the fall his pain resolved but ~10 days pt began having difficulty ambulating due to pain. He denies LOC and head injury after fall. He also denies back pain, CP, abdominal pain, urinary/bowel incontinence and recent second fall. Pt normally ambulates without cane/walker. He has taken aleve without relief. Pt is not currently on blood thinners.  PCP- Rogelia Rohrer  Past Medical History  Diagnosis Date  . Coronary artery disease   . LV dysfunction   . Hyperlipidemia   . Hypertension   . SVT (supraventricular tachycardia)   . GERD (gastroesophageal reflux disease)   . Atrial fibrillation    Past Surgical History  Procedure Laterality Date  . Inguinal hernia repair    . Cardiac catheterization  04/10/2005    EF 45%. SEVERE 3 VESSEL OBSTRUCTIVE  CAD. PATENT LIMA GRAFT TO LAD. MILD TO MODERATE LV DYSFUNCTION. OCCLUSION OF THE VEIN GRAFT TO THE DIAGONIAL  . Cardiovascular stress test  03/31/2005    EF 43%. ISCHEMIA AND OR CAR IN THE ANTERIOAPICAL DISTRIBUTION. THERE IS A FIXED DEFECT IN THE INFEROBASAL REGION.  Marland Kitchen Coronary artery bypass graft  1993    SAPHENOUS VEIN GRAFT TO THE FIRST OBTUSE MARGINAL VESSEL, SAPHENOUS VEIN GRAFT TO THE SECOND  DIAGONAL, SAPHENOUS VEIN GRAFT TO THE FIRST DIAGONAL , AND LIMA GRAFT TO THE LAD   Family History  Problem Relation Age of Onset  . Coronary artery disease Father   . Heart disease  Father   . Heart attack Father   . Heart disease Mother   . Heart attack Brother    History  Substance Use Topics  . Smoking status: Former Smoker    Quit date: 06/05/1991  . Smokeless tobacco: Never Used  . Alcohol Use: No    Review of Systems  Cardiovascular: Negative for chest pain.  Gastrointestinal: Negative for abdominal pain.  Musculoskeletal: Positive for myalgias and arthralgias. Negative for back pain.  All other systems reviewed and are negative.     Allergies  Verapamil hcl er  Home Medications   Prior to Admission medications   Medication Sig Start Date End Date Taking? Authorizing Provider  aspirin 81 MG tablet Take 81 mg by mouth daily.      Historical Provider, MD  atenolol (TENORMIN) 50 MG tablet Take 1.5 tablets (75 mg total) by mouth daily. 06/19/14   Peter M [place], MD  benazepril (LOTENSIN) 20 MG tablet Take 1 tablet (20 mg total) by mouth 2 (two) times daily. 06/19/14   Peter M [place], MD  Coenzyme Q10 (CO Q 10) 100 MG CAPS Take 1 capsule by mouth daily. 06/19/14   Peter M [place], MD  Cyanocobalamin (VITAMIN B-12) 1000 MCG SUBL Place under the tongue daily.    Historical Provider, MD  feeding supplement (BOOST HIGH PROTEIN) LIQD Take 1 can daily 06/19/14  Peter M [place], MD  hydrochlorothiazide (MICROZIDE) 12.5 MG capsule Take 1 capsule (12.5 mg total) by mouth daily. 06/19/14   Peter M [place], MD  HYDROcodone-acetaminophen (NORCO/VICODIN) 5-325 MG per tablet Take 1 tablet by mouth every 4 (four) hours as needed. 10/08/14   Glynn Octave, MD  isosorbide mononitrate (IMDUR) 60 MG 24 hr tablet Take 1 tablet (60 mg total) by mouth 2 (two) times daily. 06/19/14   Peter M [place], MD  LORazepam (ATIVAN) 1 MG tablet Take 1 tablet (1 mg total) by mouth 3 (three) times daily. 05/30/14   Peter M [place], MD  naproxen (NAPROSYN) 500 MG tablet Take 1 tablet (500 mg total) by mouth 2 (two) times daily. 10/08/14   Glynn Octave, MD  Nutritional Supplements (PROTEIN  SUPPLEMENT 80% PO) Take 30 mg by mouth daily.    Historical Provider, MD  pantoprazole (PROTONIX) 40 MG tablet Take 1 tablet (40 mg total) by mouth as needed. 06/19/14   Peter M [place], MD  polyethylene glycol [facility]) packet Take 17 g by mouth daily. 06/19/14   Peter M [place], MD   BP 160/95 mmHg  Pulse 78  Temp(Src) 98.4 F (36.9 C) (Oral)  Resp 16  Ht 5\' 6"  (1.676 m)  Wt 134 lb (60.782 kg)  BMI 21.64 kg/m2  SpO2 99% Physical Exam  Constitutional: He is oriented to person, place, and time. He appears well-developed and well-nourished. No distress.  HENT:  Head: Normocephalic and atraumatic.  Mouth/Throat: Oropharynx is clear and moist. No oropharyngeal exudate.  Eyes: Conjunctivae and EOM are normal. Pupils are equal, round, and reactive to light.  Neck: Normal range of motion. Neck supple.  No meningismus.  Cardiovascular: Normal rate, regular rhythm, normal heart sounds and intact distal pulses.   No murmur heard. Pulmonary/Chest: Effort normal and breath sounds normal. No respiratory distress.     Abdominal: Soft. There is no tenderness. There is no rebound and no guarding.  Musculoskeletal: Normal range of motion. He exhibits no edema or tenderness.  No lumbar spine TTP or stepoff No TTP Left hip  FROM left hip and knee Intact dp/pt pulse Pt points to posterior thigh as area of tenderness   Neurological: He is alert and oriented to person, place, and time. No cranial nerve deficit. He exhibits normal muscle tone. Coordination normal.  No ataxia on finger to nose bilaterally. No pronator drift. 5/5 strength throughout. CN 2-12 intact. Negative Romberg. Equal grip strength. Sensation intact. Gait is normal.   Skin: Skin is warm.  Psychiatric: He has a normal mood and affect. His behavior is normal.  Nursing note and vitals reviewed.   ED Course  Procedures  DIAGNOSTIC STUDIES:  Oxygen Saturation is 98% on RA, normal by my interpretation.    COORDINATION  OF CARE:  7:07 PM Updated pt with partial results. Will order CT scan. Discussed treatment plan with pt at bedside and pt agreed to plan.  Labs Review Labs Reviewed  BASIC METABOLIC PANEL - Abnormal; Notable for the following:    Potassium 3.3 (*)    Chloride 95 (*)    BUN 25 (*)    GFR calc non Af Amer 66 (*)    GFR calc Af Amer 77 (*)    All other components within normal limits  URINALYSIS, ROUTINE W REFLEX MICROSCOPIC - Abnormal; Notable for the following:    Ketones, ur 15 (*)    All other components within normal limits  CBC WITH DIFFERENTIAL/PLATELET  TROPONIN I  CK  Imaging Review Ct Abdomen Pelvis Wo Contrast  10/08/2014   CLINICAL DATA:  Constipation for 1 day. Bilateral hip pain since a fall 4 weeks ago.  EXAM: CT ABDOMEN AND PELVIS WITHOUT CONTRAST  TECHNIQUE: Multidetector CT imaging of the abdomen and pelvis was performed following the standard protocol without IV contrast.  COMPARISON:  CT abdomen and pelvis 02/23/2006.  FINDINGS: The lung bases are clear.  No pleural or pericardial effusion.  There is no hydronephrosis on the right or left. Calcification in lower pole the right kidney is likely vascular. A punctate calcification is seen the lower pole left kidney compatible with a nonobstructing stone. Small exophytic right renal cyst is unchanged.  Again seen is a cyst in left hepatic lobe. The liver is otherwise unremarkable. The gallbladder, adrenal glands, spleen and pancreas appear normal. There has been slight increase in the size of the infrarenal abdominal aortic which now measures up to 3.2 x 3.4 cm with extensive aortoiliac atherosclerosis present.  The prostate gland is markedly enlarged. The urinary bladder is unremarkable. The patient has a moderately large volume of stool throughout the colon. The stomach and small bowel appear normal. No lymphadenopathy or fluid is identified.  No lytic or sclerotic bony lesion is seen. No fracture is identified.  IMPRESSION:  No acute finding abdomen or pelvis.  Small infrarenal abdominal aortic aneurysm measures 3.4 cm in diameter. Recommend followup by ultrasound in 3 years. This recommendation follows ACR consensus guidelines: White Paper of the ACR Incidental Findings Committee II on Vascular Findings. [place] Coll Radiol 2013; [id number]  Prostatomegaly.  Punctate nonobstructing stone lower pole left kidney.  Moderately large stool burden throughout the colon.   Electronically Signed   By: Drusilla Kanner M.D.   On: 10/08/2014 20:00   Dg Tibia/fibula Left  10/08/2014   CLINICAL DATA:  Left lower extremity pain since a fall 4 weeks ago. Initial encounter.  EXAM: LEFT TIBIA AND FIBULA - 2 VIEW  COMPARISON:  None.  FINDINGS: No acute bony or joint abnormality is identified. Atherosclerotic vascular disease and vascular clips are noted. Soft tissues are otherwise unremarkable.  IMPRESSION: No acute finding.   Electronically Signed   By: Drusilla Kanner M.D.   On: 10/08/2014 20:01   US Venous Img Lower Unilateral Left  10/08/2014   CLINICAL DATA:  Left lower extremity pain for 3 weeks  EXAM: Left LOWER EXTREMITY VENOUS DOPPLER ULTRASOUND  TECHNIQUE: Gray-scale sonography with graded compression, as well as color Doppler and duplex ultrasound were performed to evaluate the lower extremity deep venous systems from the level of the common femoral vein and including the common femoral, femoral, profunda femoral, popliteal and calf veins including the posterior tibial, peroneal and gastrocnemius veins when visible. The superficial great saphenous vein was also interrogated. Spectral Doppler was utilized to evaluate flow at rest and with distal augmentation maneuvers in the common femoral, femoral and popliteal veins.  COMPARISON:  None.  FINDINGS: Contralateral Common Femoral Vein: Respiratory phasicity is normal and symmetric with the symptomatic side. No evidence of thrombus. Normal compressibility.  Common Femoral Vein: No evidence of  thrombus. Normal compressibility, respiratory phasicity and response to augmentation.  Saphenofemoral Junction: No evidence of thrombus. Normal compressibility and flow on color Doppler imaging.  Profunda Femoral Vein: No evidence of thrombus. Normal compressibility and flow on color Doppler imaging.  Femoral Vein: No evidence of thrombus. Normal compressibility, respiratory phasicity and response to augmentation.  Popliteal Vein: No evidence of thrombus. Normal compressibility, respiratory phasicity and  response to augmentation.  Calf Veins: No evidence of thrombus. Normal compressibility and flow on color Doppler imaging.  Superficial Great Saphenous Vein: Surgically removed for coronary bypass graft.  Venous Reflux:  None.  Other Findings:  None.  IMPRESSION: No evidence of deep venous thrombosis.   Electronically Signed   By: Ellery Plunkaniel R Mitchell M.D.   On: 10/08/2014 21:13   Dg Hip Unilat With Pelvis 2-3 Views Left  10/08/2014   CLINICAL DATA:  Left hip pain after fall 1 month ago  EXAM: LEFT HIP (WITH PELVIS) 2-3 VIEWS  COMPARISON:  None.  FINDINGS: Mild bilateral hip arthritis. No fracture or dislocation. Pelvic bones are intact. Sacrum obscured by overlying bowel contents.  IMPRESSION: No fracture or dislocation identified. If concern for radiographically occult fracture consider MRI.   Electronically Signed   By: Esperanza Heiraymond  Rubner M.D.   On: 10/08/2014 17:56   Dg Femur Min 2 Views Left  10/08/2014   CLINICAL DATA:  Left lower extremity pain after a fall 4 weeks ago and landing on left side  EXAM: LEFT FEMUR 2 VIEWS  COMPARISON:  None.  FINDINGS: Extensive femoral vascular calcification. No fracture or dislocation.  IMPRESSION: No acute osseous abnormalities.   Electronically Signed   By: Esperanza Heiraymond  Rubner M.D.   On: 10/08/2014 20:00     EKG Interpretation None      MDM   Final diagnoses:  Fall  Pain of left lower extremity   1 week history of pain in his left leg worse with ambulation. Fall  several weeks ago without head injury. No back pain or abdominal pain. No pain at rest.  Neurovascularly intact. Hip Xray negative. Doppler negative for DVT.  CT scan shows no pelvic fracture or other abnormality. Patient informed of dilated aorta need for 3 year follow-up.  He is able to ambulate without assistance. He feels better with leaning forward. He may have some element of spinal stenosis. Labs are negative including CK and renal function.  we'll treat with pain medication and anti-inflammatories. Follow with PCP for possible MRI if symptoms persist. Patient is able to ambulate and daughter is comfortable taking him home.  BP 160/95 mmHg  Pulse 78  Temp(Src) 98.4 F (36.9 C) (Oral)  Resp 16  Ht 5\' 6"  (1.676 m)  Wt 134 lb (60.782 kg)  BMI 21.64 kg/m2  SpO2 99%    I personally performed the services described in this documentation, which was scribed in my presence. The recorded information has been reviewed and is accurate.   Glynn OctaveStephen Antoney Biven, MD 10/09/14 [phone]

## 2014-10-08 NOTE — ED Notes (Signed)
Ambulated Pt and states the Left side of butt, hamstring, and calf tightness when standing straight, when Pt ambulates in a tripod position the tightness on the left side is less. Pt states he has a hard time moving in bed to either side.

## 2014-10-08 NOTE — Discharge Instructions (Signed)
Muscle Pain Your x-rays are negative for broken bones or blood clots. You need an ultrasound of your aorta in 3 years. Follow up with your doctor because you may need an MRI of her back. Return to the ED if he develop new or worsening symptoms. Muscle pain (myalgia) may be caused by many things, including:  Overuse or muscle strain, especially if you are not in shape. This is the most common cause of muscle pain.  Injury.  Bruises.  Viruses, such as the flu.  Infectious diseases.  Fibromyalgia, which is a chronic condition that causes muscle tenderness, fatigue, and headache.  Autoimmune diseases, including lupus.  Certain drugs, including ACE inhibitors and statins. Muscle pain may be mild or severe. In most cases, the pain lasts only a short time and goes away without treatment. To diagnose the cause of your muscle pain, your health care provider will take your medical history. This means he or she will ask you when your muscle pain began and what has been happening. If you have not had muscle pain for very long, your health care provider may want to wait before doing much testing. If your muscle pain has lasted a long time, your health care provider may want to run tests right away. If your health care provider thinks your muscle pain may be caused by illness, you may need to have additional tests to rule out certain conditions.  Treatment for muscle pain depends on the cause. Home care is often enough to relieve muscle pain. Your health care provider may also prescribe anti-inflammatory medicine. HOME CARE INSTRUCTIONS Watch your condition for any changes. The following actions may help to lessen any discomfort you are feeling:  Only take over-the-counter or prescription medicines as directed by your health care provider.  Apply ice to the sore muscle:  Put ice in a plastic bag.  Place a towel between your skin and the bag.  Leave the ice on for 15-20 minutes, 3-4 times a  day.  You may alternate applying hot and cold packs to the muscle as directed by your health care provider.  If overuse is causing your muscle pain, slow down your activities until the pain goes away.  Remember that it is normal to feel some muscle pain after starting a workout program. Muscles that have not been used often will be sore at first.  Do regular, gentle exercises if you are not usually active.  Warm up before exercising to lower your risk of muscle pain.  Do not continue working out if the pain is very bad. Bad pain could mean you have injured a muscle. SEEK MEDICAL CARE IF:  Your muscle pain gets worse, and medicines do not help.  You have muscle pain that lasts longer than 3 days.  You have a rash or fever along with muscle pain.  You have muscle pain after a tick bite.  You have muscle pain while working out, even though you are in good physical condition.  You have redness, soreness, or swelling along with muscle pain.  You have muscle pain after starting a new medicine or changing the dose of a medicine. SEEK IMMEDIATE MEDICAL CARE IF:  You have trouble breathing.  You have trouble swallowing.  You have muscle pain along with a stiff neck, fever, and vomiting.  You have severe muscle weakness or cannot move part of your body. MAKE SURE YOU:   Understand these instructions.  Will watch your condition.  Will get help right away  if you are not doing well or get worse. Document Released: 06/04/2006 Document Revised: 07/18/2013 Document Reviewed: 05/09/2013 [facility] Patient Information 2015 [place], [place]. This information is not intended to replace advice given to you by your health care provider. Make sure you discuss any questions you have with your health care provider.

## 2014-10-08 NOTE — ED Notes (Signed)
Pt states he fell about a month ago but states his leg still hurts.  Daughter thinks he may have hurt his hip.

## 2014-10-12 ENCOUNTER — Encounter: Payer: Self-pay | Admitting: Physician Assistant

## 2014-10-12 ENCOUNTER — Ambulatory Visit (INDEPENDENT_AMBULATORY_CARE_PROVIDER_SITE_OTHER): Payer: Medicare HMO | Admitting: Physician Assistant

## 2014-10-12 ENCOUNTER — Ambulatory Visit: Payer: Medicare HMO | Admitting: Physician Assistant

## 2014-10-12 VITALS — BP 122/70 | HR 90 | Temp 98.0°F | Wt 134.0 lb

## 2014-10-12 DIAGNOSIS — R531 Weakness: Secondary | ICD-10-CM

## 2014-10-12 DIAGNOSIS — M25552 Pain in left hip: Secondary | ICD-10-CM

## 2014-10-12 NOTE — Assessment & Plan Note (Signed)
With endorsed weakness.  Worsening.  Workup including DG hip and lower extremity, US and Ct abdomen unremarkable.  Will proceed with MRI.  Pain management discussed.  Stool softener PRN. Will know more once results are in.

## 2014-10-12 NOTE — Progress Notes (Signed)
Patient presents to clinic today for ER follow-up.   Patient was seen in the ER on 10/08/14 c/o LLE pain following a fall 4 weeks prior. Pain initially resolved but 10 days prior to ER visit returned with full force. Endorses pain and difficulty ambulating. Aleve was not helping. Examination was within normal limits.  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  Radiology indicated [place] would be needed in 3 years. Dg of left tibia/fibula without concerning findings. DG left hip reveals negative. [place] negative for DVT.  Patient discharged with Rx for Vicodin  Instructed MRI would possibly be beneficial if symptoms persisted.  Since discharge patient states pain and ambulation has worsened. Is not taking Vicodin due to concern of constipation.  Denies numbness or tingling of extremities.  Denies new symptoms.  Past Medical History  Diagnosis Date  . Coronary artery disease   . LV dysfunction   . Hyperlipidemia   . Hypertension   . SVT (supraventricular tachycardia)   . GERD (gastroesophageal reflux disease)   . Atrial fibrillation     Current Outpatient Prescriptions on File Prior to Visit  Medication Sig Dispense Refill  . aspirin 81 MG tablet Take 81 mg by mouth daily.      Marland Kitchen atenolol (TENORMIN) 50 MG tablet Take 1.5 tablets (75 mg total) by mouth daily. 90 tablet 3  . benazepril (LOTENSIN) 20 MG tablet Take 1 tablet (20 mg total) by mouth 2 (two) times daily. 90 tablet 3  . Coenzyme Q10 (CO Q 10) 100 MG CAPS Take 1 capsule by mouth daily. 90 capsule 3  . Cyanocobalamin (VITAMIN B-12) 1000 MCG SUBL Place under the tongue daily.    . feeding supplement (BOOST HIGH PROTEIN) LIQD Take 1 can daily 30 Can 11  . hydrochlorothiazide (MICROZIDE) 12.5 MG capsule Take 1 capsule (12.5 mg total) by mouth daily. 90 capsule 3  . HYDROcodone-acetaminophen (NORCO/VICODIN) 5-325 MG per tablet Take 1 tablet by mouth every 4 (four) hours as needed. 10 tablet 0   . isosorbide mononitrate (IMDUR) 60 MG 24 hr tablet Take 1 tablet (60 mg total) by mouth 2 (two) times daily. 90 tablet 3  . LORazepam (ATIVAN) 1 MG tablet Take 1 tablet (1 mg total) by mouth 3 (three) times daily. 270 tablet 3  . naproxen (NAPROSYN) 500 MG tablet Take 1 tablet (500 mg total) by mouth 2 (two) times daily. 30 tablet 0  . Nutritional Supplements (PROTEIN SUPPLEMENT 80% PO) Take 30 mg by mouth daily.    . pantoprazole (PROTONIX) 40 MG tablet Take 1 tablet (40 mg total) by mouth as needed. 90 tablet 3  . polyethylene glycol (MIRALAX / GLYCOLAX) packet Take 17 g by mouth daily. 30 each 11   No current facility-administered medications on file prior to visit.    Allergies  Allergen Reactions  . Verapamil Hcl Er     Family History  Problem Relation Age of Onset  . Coronary artery disease Father   . Heart disease Father   . Heart attack Father   . Heart disease Mother   . Heart attack Brother     History   Social History  . Marital Status: Widowed    Spouse Name: N/A  . Number of Children: 3  . Years of Education: N/A   Occupational History  . 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 Smoking status: Former Audiological scientist  date: 06/05/1991  . Smokeless tobacco: Never Used  . Alcohol Use: No  . Drug Use: No  . Sexual Activity: Not Currently   Other Topics Concern  . None   Social History Narrative   Wife passed 2011- lives alone in [place]   Has daughter in [place]   One son in [place]   Daughter in [place]   Retired Administrator   Completed 2 masters degrees- Dealer    [place] came here at age 85- to attend [employer].   Enjoys working on computers- skyping with his relatives, friends         Review of Systems - See HPI.  All other ROS are negative.  BP 122/70 mmHg  Pulse 90  Temp(Src) 98 F (36.7 C)  Wt 134 lb (60.782 kg)  SpO2 99%  Physical Exam  Constitutional: He is oriented to person,  place, and time and well-developed, well-nourished, and in no distress.  HENT:  Head: Normocephalic and atraumatic.  Cardiovascular: Normal rate, regular rhythm, normal heart sounds and intact distal pulses.   Pulmonary/Chest: Effort normal and breath sounds normal. No respiratory distress. He has no wheezes. He has no rales. He exhibits no tenderness.  Musculoskeletal:       Right hip: He exhibits normal range of motion and normal strength.       Left hip: He exhibits normal range of motion, normal strength and no tenderness.       Left knee: He exhibits normal range of motion. No tenderness found.  Neurological: He is alert and oriented to person, place, and time.  Skin: Skin is warm and dry. No erythema.  Psychiatric: Affect normal.  Vitals reviewed.   Recent Results (from the past 2160 hour(s))  Urinalysis, Routine w reflex microscopic     Status: Abnormal   Collection Time: 10/08/14  7:15 PM  Result Value Ref Range   Color, Urine YELLOW YELLOW   APPearance CLEAR CLEAR   Specific Gravity, Urine 1.019 1.005 - 1.030   pH 5.5 5.0 - 8.0   Glucose, UA NEGATIVE NEGATIVE mg/dL   Hgb urine dipstick NEGATIVE NEGATIVE   Bilirubin Urine NEGATIVE NEGATIVE   Ketones, ur 15 (A) NEGATIVE mg/dL   Protein, ur NEGATIVE NEGATIVE mg/dL   Urobilinogen, UA 1.0 0.0 - 1.0 mg/dL   Nitrite NEGATIVE NEGATIVE   Leukocytes, UA NEGATIVE NEGATIVE    Comment: MICROSCOPIC NOT DONE ON URINES WITH NEGATIVE PROTEIN, BLOOD, LEUKOCYTES, NITRITE, OR GLUCOSE <1000 mg/dL.  CBC with Differential/Platelet     Status: None   Collection Time: 10/08/14  7:40 PM  Result Value Ref Range   WBC 7.9 4.0 - 10.5 K/uL   RBC 5.03 4.22 - 5.81 MIL/uL   Hemoglobin 14.1 13.0 - 17.0 g/dL   HCT 40.9 39.0 - 52.0 %   MCV 81.3 78.0 - 100.0 fL   MCH 28.0 26.0 - 34.0 pg   MCHC 34.5 30.0 - 36.0 g/dL   RDW 14.1 11.5 - 15.5 %   Platelets 217 150 - 400 K/uL   Neutrophils Relative % 68 43 - 77 %   Neutro Abs 5.4 1.7 - 7.7 K/uL    Lymphocytes Relative 21 12 - 46 %   Lymphs Abs 1.6 0.7 - 4.0 K/uL   Monocytes Relative 10 3 - 12 %   Monocytes Absolute 0.8 0.1 - 1.0 K/uL   Eosinophils Relative 1 0 - 5 %   Eosinophils Absolute 0.1 0.0 - 0.7 K/uL  Basophils Relative 0 0 - 1 %   Basophils Absolute 0.0 0.0 - 0.1 K/uL  Basic metabolic panel     Status: Abnormal   Collection Time: 10/08/14  7:40 PM  Result Value Ref Range   Sodium 135 135 - 145 mmol/L   Potassium 3.3 (L) 3.5 - 5.1 mmol/L   Chloride 95 (L) 96 - 112 mmol/L   CO2 28 19 - 32 mmol/L   Glucose, Bld 90 70 - 99 mg/dL   BUN 25 (H) 6 - 23 mg/dL   Creatinine, Ser 1.01 0.50 - 1.35 mg/dL   Calcium 9.2 8.4 - 10.5 mg/dL   GFR calc non Af Amer 66 (L) >90 mL/min   GFR calc Af Amer 77 (L) >90 mL/min    Comment: (NOTE) The eGFR has been calculated using the CKD EPI equation. This calculation has not been validated in all clinical situations. eGFR's persistently <90 mL/min signify possible Chronic Kidney Disease.    Anion gap 12 5 - 15  Troponin I     Status: None   Collection Time: 10/08/14  7:40 PM  Result Value Ref Range   Troponin I 0.03 <0.031 ng/mL    Comment:        NO INDICATION OF MYOCARDIAL INJURY.   CK     Status: None   Collection Time: 10/08/14  7:40 PM  Result Value Ref Range   Total CK 123 7 - 232 U/L    Assessment/Plan: Left hip pain With endorsed weakness.  Worsening.  Workup including DG hip and lower extremity, [place] and Ct abdomen unremarkable.  Will proceed with MRI.  Pain management discussed.  Stool softener PRN. Will know more once results are in.

## 2014-10-12 NOTE — Progress Notes (Signed)
Pre visit review using our clinic review tool, if applicable. No additional management support is needed unless otherwise documented below in the visit note. 

## 2014-10-12 NOTE — Patient Instructions (Signed)
Please take the Vicodin as directed. Stay well-hydrated and take a stool softener to help reduce chance of constipation. Rest. Please stay with your daughter this weekend. We will call you as soon as the MRI is pre-certified so it can be scheduled.  If anything acutely worsens over the weekend, please go to the ER.

## 2014-10-15 ENCOUNTER — Telehealth: Payer: Self-pay | Admitting: Physician Assistant

## 2014-10-15 NOTE — Telephone Encounter (Signed)
Caller name: Robyne Askewauline Lancaster  Relation to pt: daughter  Call back number: [phone]   Reason for call:  As per pt daughter Robyne Askewauline Lancaster is requesting RX for MRI [facility] [address], [place], [place] [address]  [phone] and fax # [phone]

## 2014-10-16 NOTE — Telephone Encounter (Signed)
Order faxed this morning.

## 2014-10-22 ENCOUNTER — Telehealth: Payer: Self-pay | Admitting: Physician Assistant

## 2014-10-22 DIAGNOSIS — M1612 Unilateral primary osteoarthritis, left hip: Secondary | ICD-10-CM

## 2014-10-22 DIAGNOSIS — M5137 Other intervertebral disc degeneration, lumbosacral region: Secondary | ICD-10-CM

## 2014-10-22 DIAGNOSIS — M7070 Other bursitis of hip, unspecified hip: Secondary | ICD-10-CM

## 2014-10-22 NOTE — Telephone Encounter (Signed)
Caller name: Jonny Ruizjohn Relation to pt: self Call back number: [phone] Pharmacy:  Reason for call:   Requesting MRI results

## 2014-10-23 ENCOUNTER — Telehealth: Payer: Self-pay | Admitting: Physician Assistant

## 2014-10-23 ENCOUNTER — Encounter: Payer: Self-pay | Admitting: Physician Assistant

## 2014-10-23 NOTE — Telephone Encounter (Signed)
Called patient with results:  MRI reveals significant OA of left hip, severe lumbosacral DDD, left iliopsoas bursitis.  Referral placed to Orthopedic Surgery.

## 2014-10-23 NOTE — Telephone Encounter (Signed)
Caller name: Darryl [name], Sharon A Relation to pt: self  Call back number: [phone]   Reason for call:  Pt requesting imaging results

## 2014-10-23 NOTE — Telephone Encounter (Signed)
Also, requesting this to be put on mychart

## 2014-10-23 NOTE — Telephone Encounter (Signed)
Patient has been informed of results via provider personally provider and has been informed that results cannot be sent via My Chart d/t be done at outside facility, but that I will place a copy in the mail to him/SLS

## 2014-10-23 NOTE — Telephone Encounter (Signed)
Patient is also requesting that the results be mailed to him

## 2014-10-24 NOTE — Telephone Encounter (Signed)
Call Documentation      Regis BillSharon L Alison Kubicki, CMA at 10/23/2014 5:35 PM     Status: Signed       Expand All Collapse All   Patient has been informed of results via provider personally provider and has been informed that results cannot be sent via My Chart d/t be done at outside facility, but that I will place [name] copy in the mail to him/SLS        Dedra SkeensStephanie S Stevens at 10/23/2014 2:23 PM     Status: Signed       Expand All Collapse All   Also, requesting this to be put on mychart        Dedra SkeensStephanie S Stevens at 10/23/2014 2:22 PM     Status: Signed       Expand All Collapse All   Patient is also requesting that the results be mailed to him        Elliot Gaultiffany M Bell at 10/23/2014 11:07 AM     Status: Signed       Expand All Collapse All   Caller name: Darryl [name], Darryl [name] Relation to pt: self  Call back number: [phone]   Reason for call:  Pt requesting imaging results

## 2014-11-14 ENCOUNTER — Telehealth: Payer: Self-pay | Admitting: Physician Assistant

## 2014-11-14 NOTE — Telephone Encounter (Signed)
Perfect.  They can send order to us as needed.

## 2014-11-14 NOTE — Telephone Encounter (Signed)
Caller name: mary--[facility] Relation to pt: Call back number: [phone] Pharmacy:  Reason for call:   Wanted to let you know that patient has been admitted to Rehab. [facility]

## 2015-05-23 ENCOUNTER — Telehealth: Payer: Self-pay

## 2015-05-23 NOTE — Telephone Encounter (Signed)
Called to schedule Medicare Wellness Visit with [facility] Coach.  Left a message for call back.  

## 2016-03-01 IMAGING — DX DG FEMUR 2+V*L*
4 series · 4 of 4 positions shown · non-contrast
Comparison: None.

CLINICAL DATA: Left lower extremity pain after a fall 4 weeks ago
and landing on left side

EXAM:
LEFT FEMUR 2 VIEWS

[femur ap (1 of 2)]
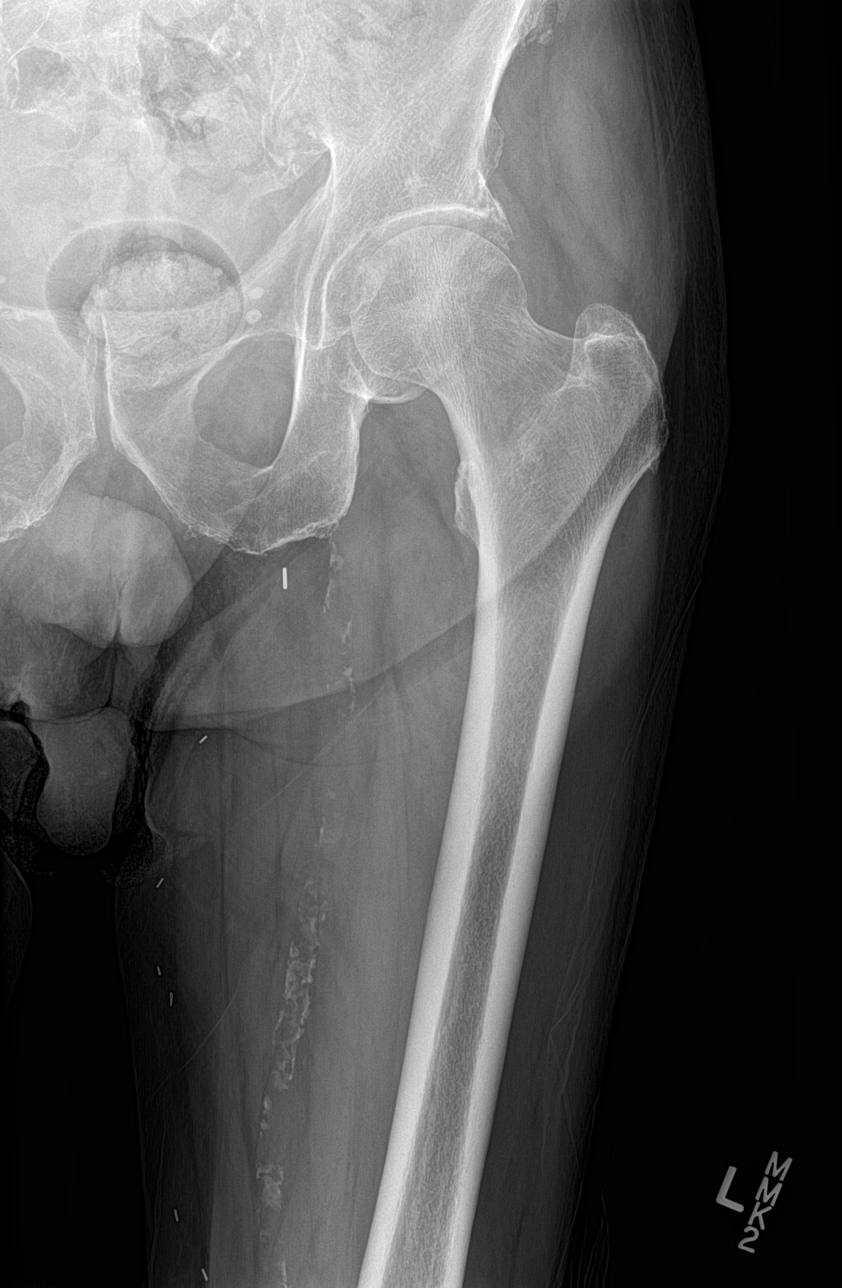

[femur ap (2 of 2)]
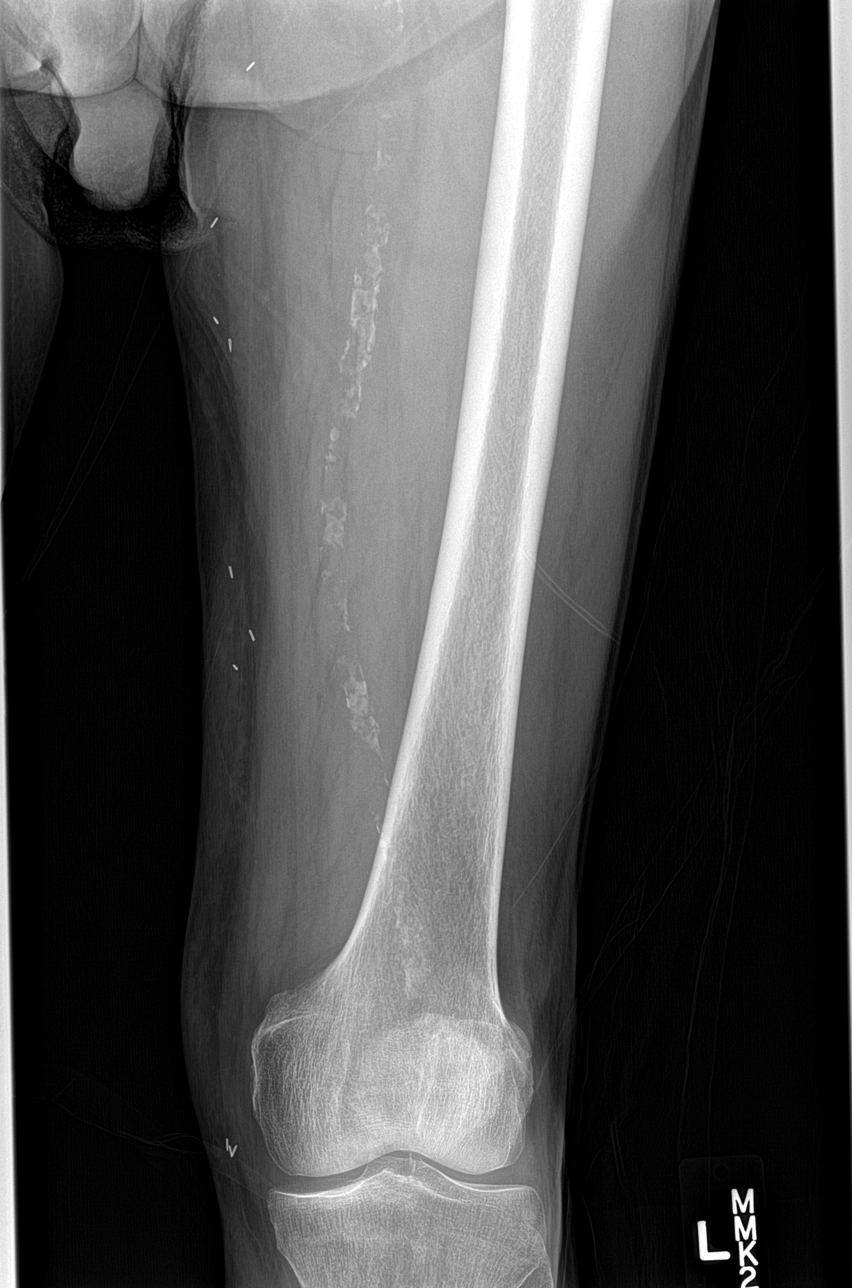

[femur lat (1 of 2)]
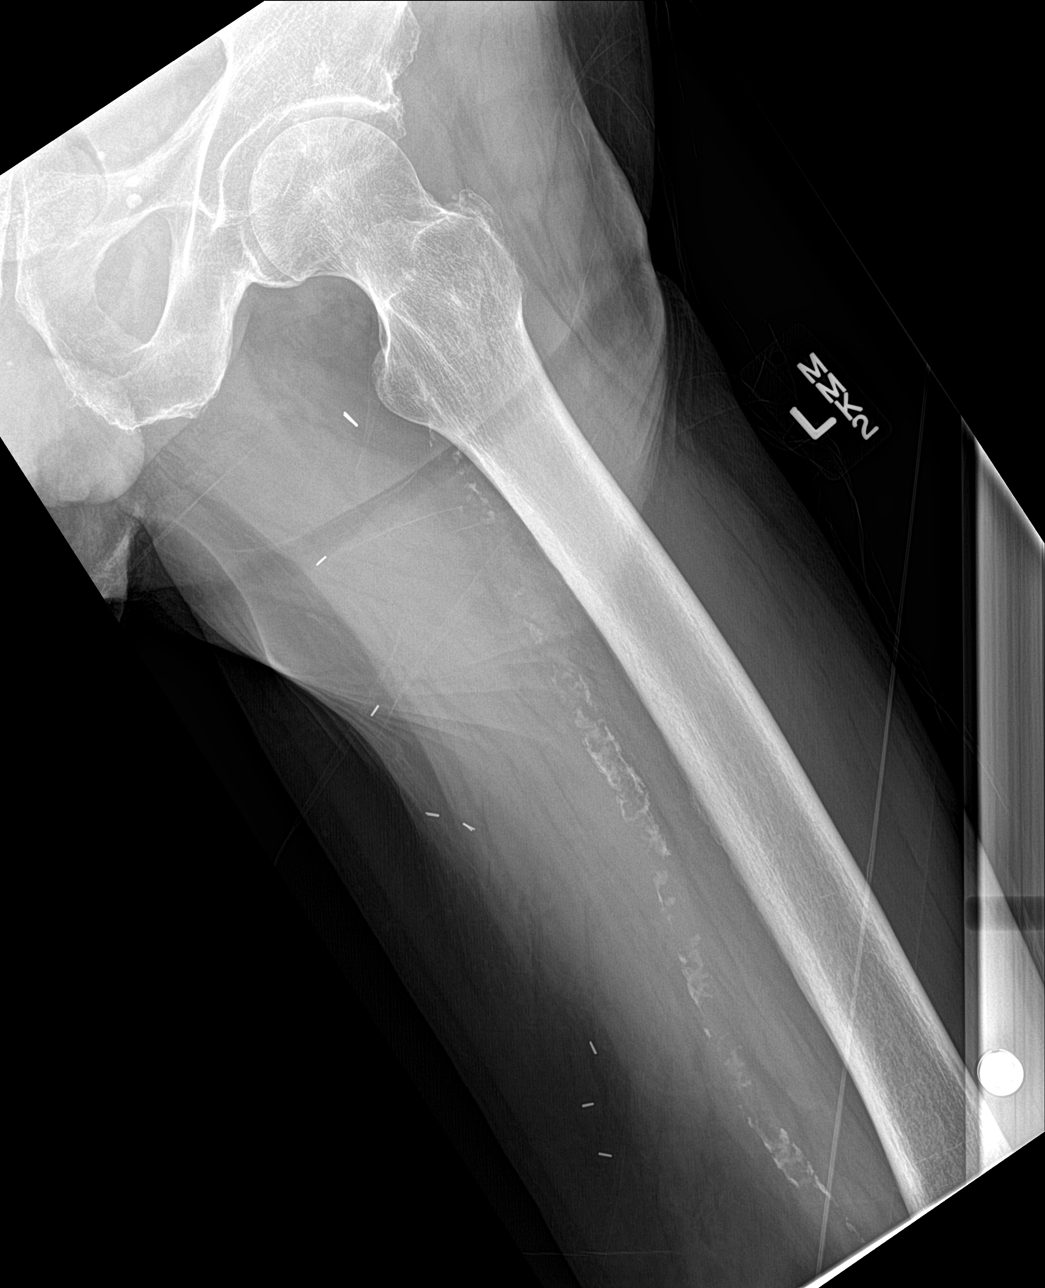

[femur lat (2 of 2)]
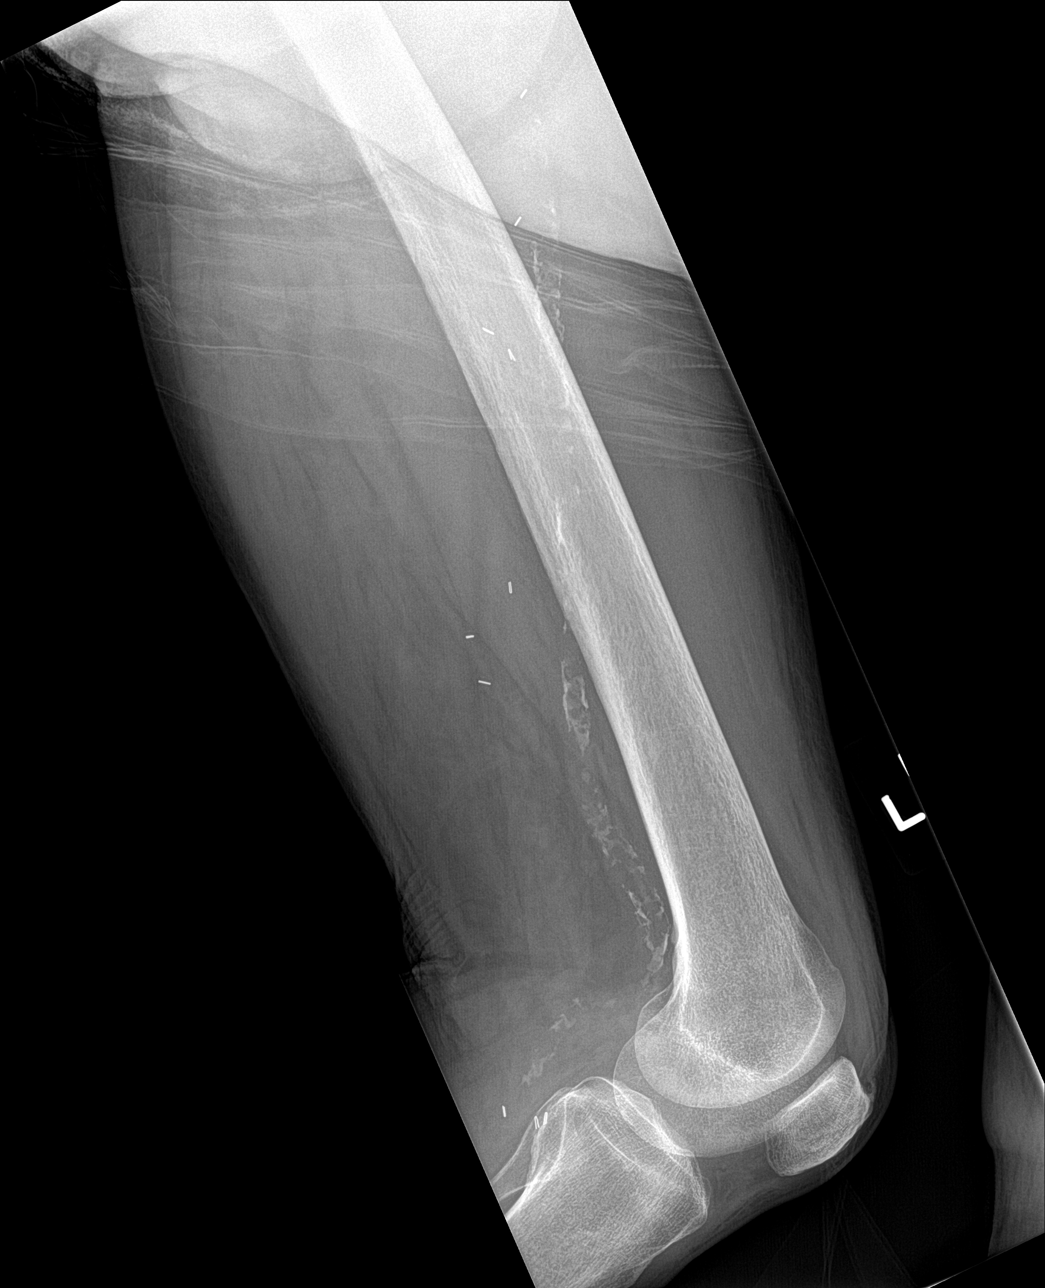

[4 of 4 positions shown; findings below may reference images not displayed]

FINDINGS: Extensive femoral vascular calcification. No fracture or
dislocation.
IMPRESSION: No acute osseous abnormalities.

## 2016-03-01 IMAGING — DX DG TIBIA/FIBULA 2V*L*
2 series · 2 of 2 positions shown · non-contrast
Comparison: None.

CLINICAL DATA: Left lower extremity pain since a fall 4 weeks ago.
Initial encounter.

EXAM:
LEFT TIBIA AND FIBULA - 2 VIEW

[tibia ap]
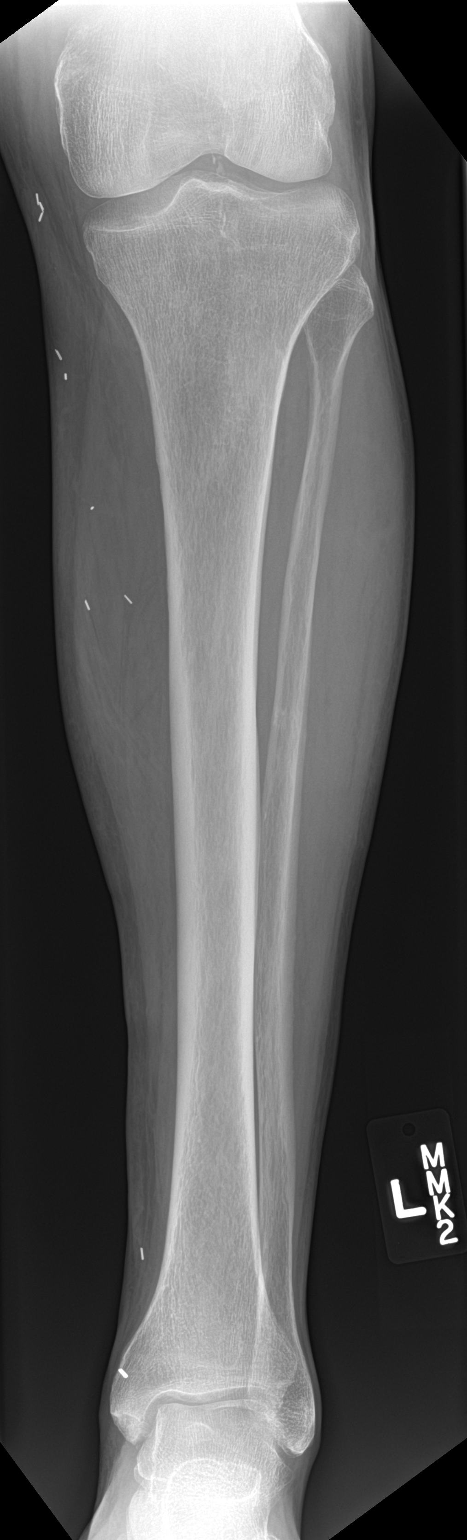

[tibia lat]
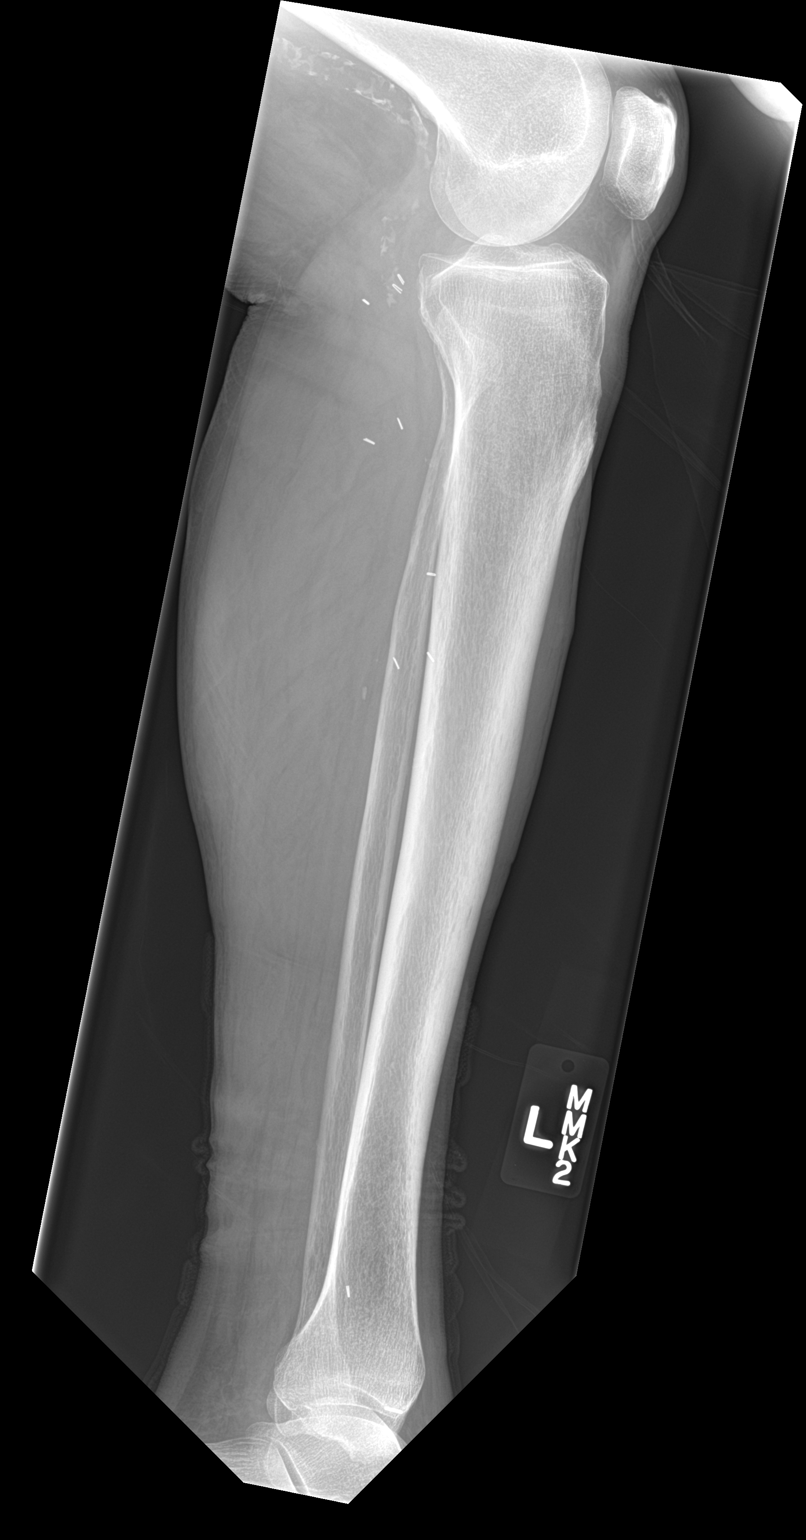

[2 of 2 positions shown; findings below may reference images not displayed]

FINDINGS: No acute bony or joint abnormality is identified. Atherosclerotic
vascular disease and vascular clips are noted. Soft tissues are
otherwise unremarkable.
IMPRESSION: No acute finding.

## 2016-03-01 IMAGING — DX DG HIP (WITH OR WITHOUT PELVIS) 2-3V*L*
3 series · 3 of 3 positions shown · non-contrast
Comparison: None.

CLINICAL DATA: Left hip pain after fall 1 month ago

EXAM:
LEFT HIP (WITH PELVIS) 2-3 VIEWS

[pelvis ap]
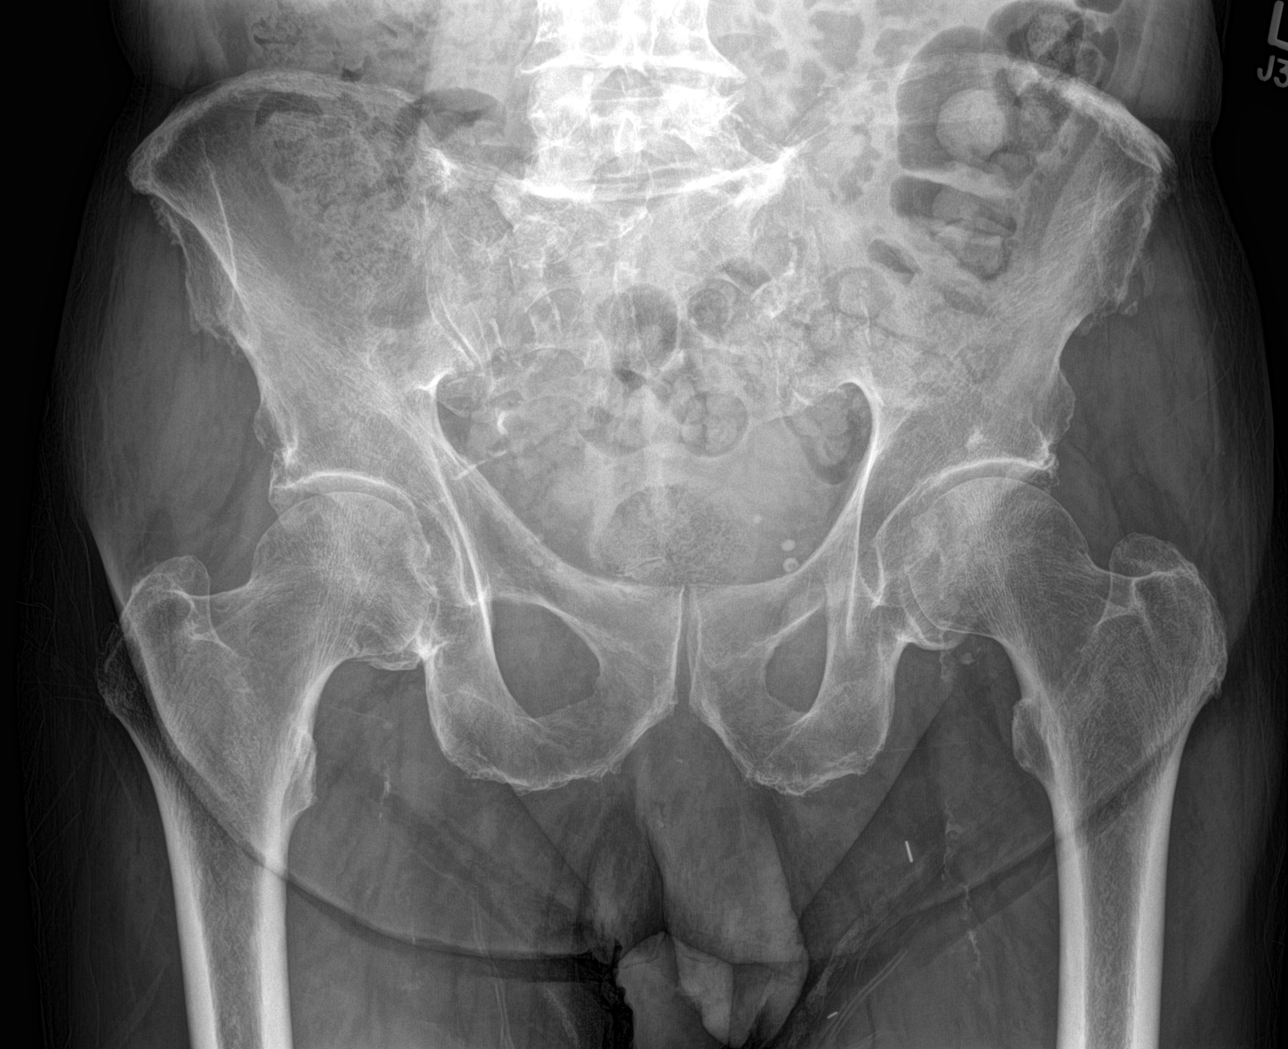

[hip ap]
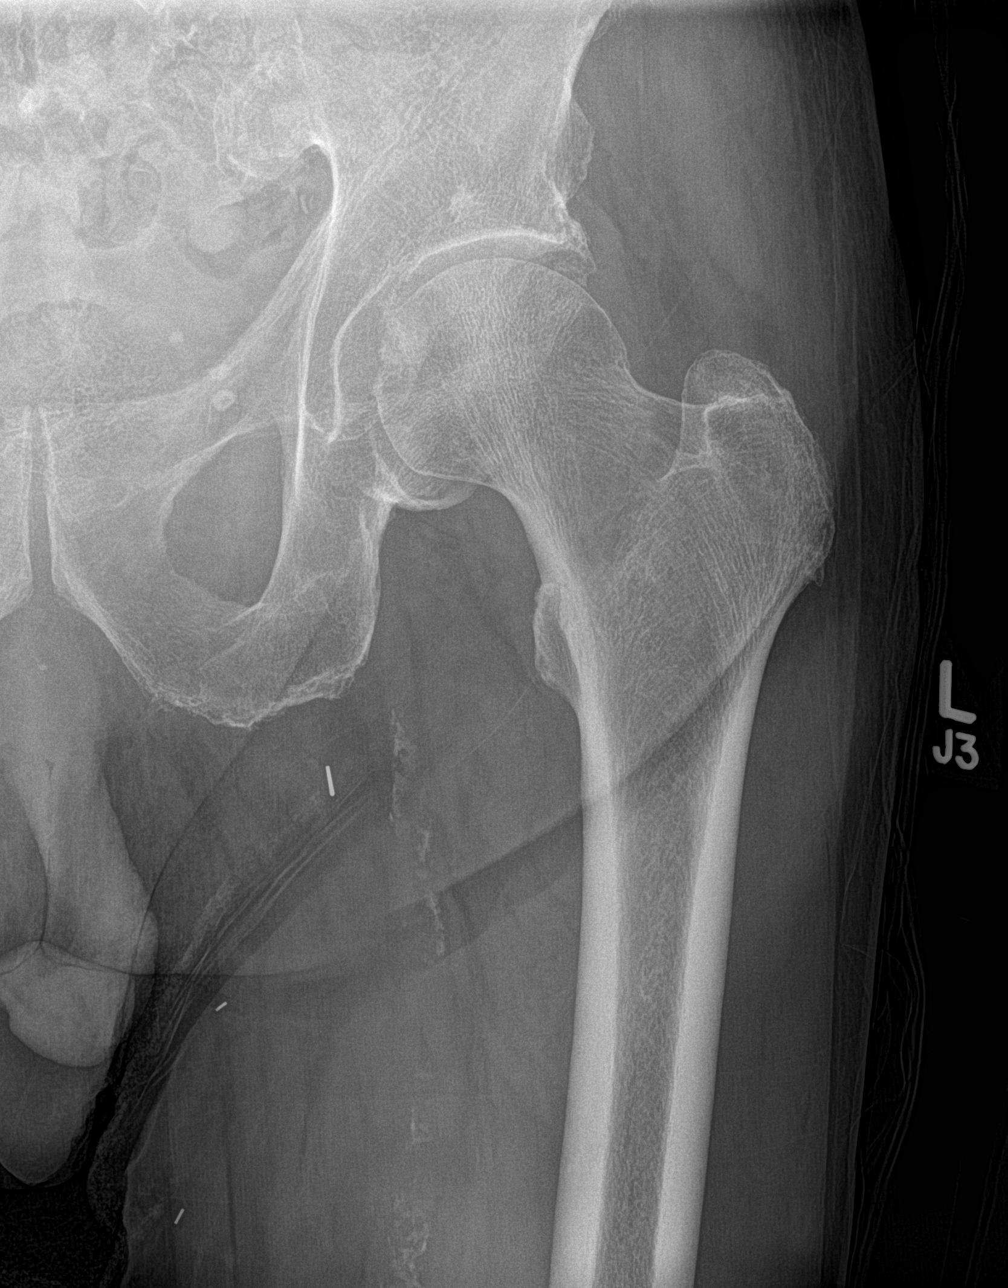

[hip lat]
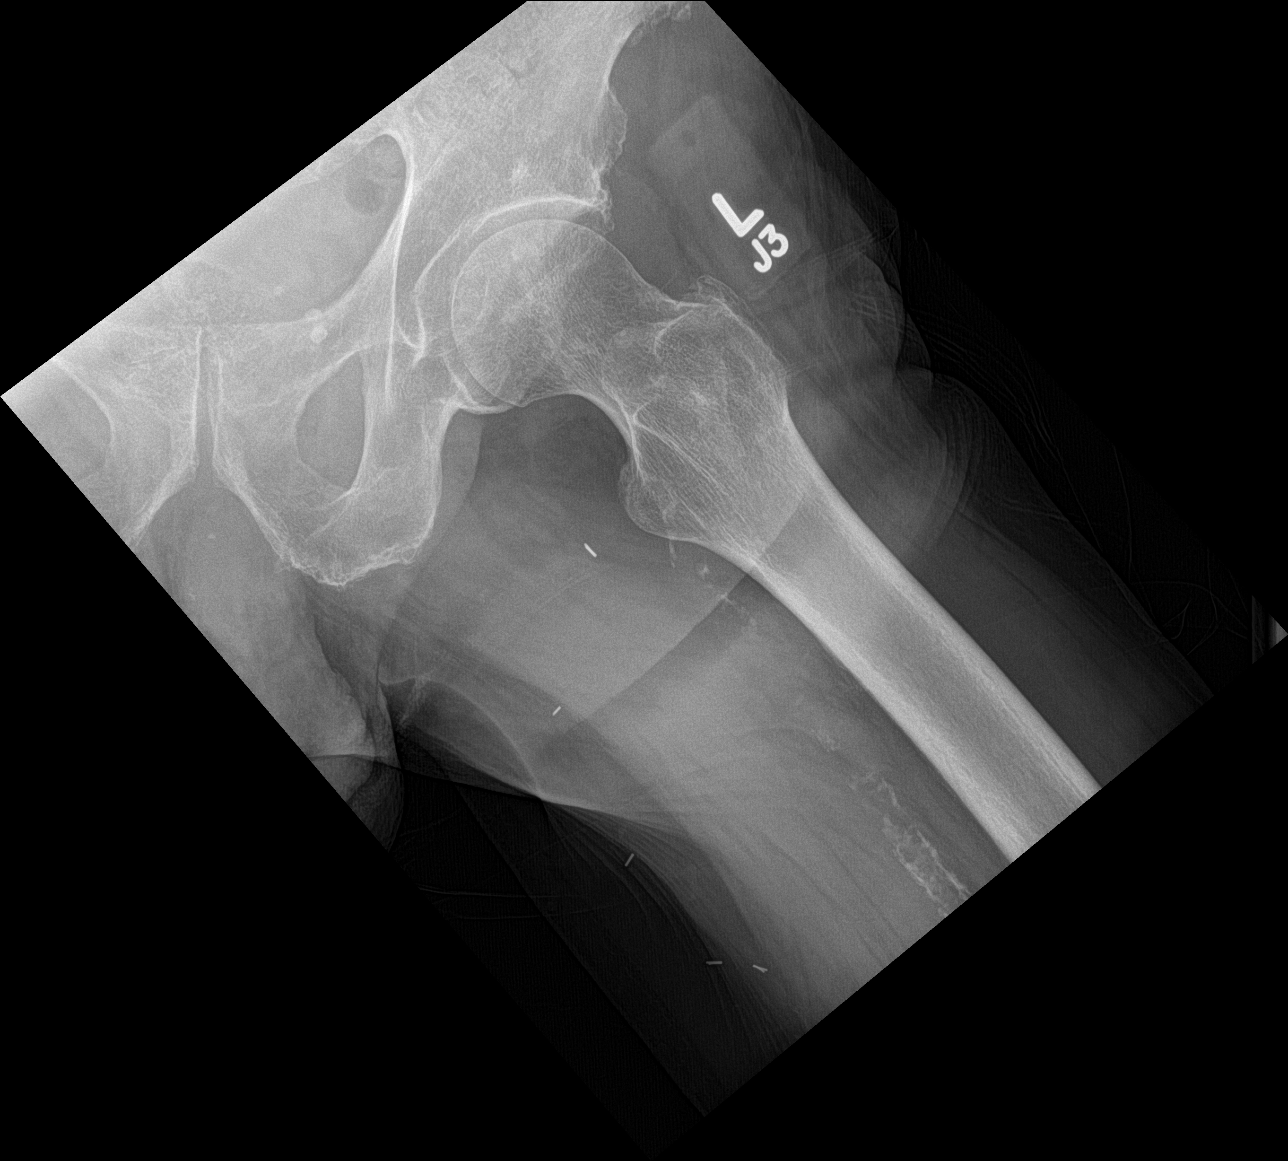

[3 of 3 positions shown; findings below may reference images not displayed]

FINDINGS: Mild bilateral hip arthritis. No fracture or dislocation. Pelvic
bones are intact. Sacrum obscured by overlying bowel contents.
IMPRESSION: No fracture or dislocation identified. If concern for
radiographically occult fracture consider MRI.
